# Patient Record
Sex: Male | Born: 1935 | Race: White | Hispanic: No | Smoking: Former smoker
Health system: Southern US, Community
[De-identification: ages and names within clinical notes are randomized; demographics above are authoritative.]

## PROBLEM LIST (undated history)

## (undated) DIAGNOSIS — E538 Deficiency of other specified B group vitamins: Secondary | ICD-10-CM

## (undated) DIAGNOSIS — I2510 Atherosclerotic heart disease of native coronary artery without angina pectoris: Secondary | ICD-10-CM

## (undated) DIAGNOSIS — I340 Nonrheumatic mitral (valve) insufficiency: Secondary | ICD-10-CM

## (undated) DIAGNOSIS — J8410 Pulmonary fibrosis, unspecified: Secondary | ICD-10-CM

## (undated) DIAGNOSIS — I10 Essential (primary) hypertension: Secondary | ICD-10-CM

## (undated) DIAGNOSIS — I4891 Unspecified atrial fibrillation: Secondary | ICD-10-CM

## (undated) DIAGNOSIS — R627 Adult failure to thrive: Secondary | ICD-10-CM

## (undated) DIAGNOSIS — I513 Intracardiac thrombosis, not elsewhere classified: Secondary | ICD-10-CM

## (undated) DIAGNOSIS — I639 Cerebral infarction, unspecified: Secondary | ICD-10-CM

## (undated) HISTORY — PX: CORONARY ARTERY BYPASS GRAFT: SHX141

## (undated) HISTORY — PX: ANKLE FRACTURE SURGERY: SHX122

## (undated) HISTORY — PX: APPENDECTOMY: SHX54

## (undated) HISTORY — PX: MITRAL VALVE ANNULOPLASTY: SHX2038

## (undated) HISTORY — PX: OTHER SURGICAL HISTORY: SHX169

## (undated) HISTORY — PX: CHOLECYSTECTOMY: SHX55

## (undated) HISTORY — PX: CATARACT EXTRACTION: SUR2

## (undated) HISTORY — PX: INGUINAL HERNIA REPAIR: SUR1180

## (undated) HISTORY — PX: ABDOMINAL ADHESION SURGERY: SHX90

---

## 2011-03-07 ENCOUNTER — Emergency Department (INDEPENDENT_AMBULATORY_CARE_PROVIDER_SITE_OTHER): Payer: Medicare Other

## 2011-03-07 ENCOUNTER — Other Ambulatory Visit: Payer: Self-pay

## 2011-03-07 ENCOUNTER — Encounter: Payer: Self-pay | Admitting: *Deleted

## 2011-03-07 ENCOUNTER — Inpatient Hospital Stay (HOSPITAL_BASED_OUTPATIENT_CLINIC_OR_DEPARTMENT_OTHER)
Admission: EM | Admit: 2011-03-07 | Discharge: 2011-03-12 | DRG: 388 | Disposition: A | Discharge status: Home / Self Care | Payer: Medicare Other | Attending: Internal Medicine | Admitting: Internal Medicine

## 2011-03-07 DIAGNOSIS — I700 Atherosclerosis of aorta: Secondary | ICD-10-CM | POA: Diagnosis present

## 2011-03-07 DIAGNOSIS — I2510 Atherosclerotic heart disease of native coronary artery without angina pectoris: Secondary | ICD-10-CM

## 2011-03-07 DIAGNOSIS — Z9889 Other specified postprocedural states: Secondary | ICD-10-CM

## 2011-03-07 DIAGNOSIS — I10 Essential (primary) hypertension: Secondary | ICD-10-CM

## 2011-03-07 DIAGNOSIS — Z951 Presence of aortocoronary bypass graft: Secondary | ICD-10-CM

## 2011-03-07 DIAGNOSIS — Z4659 Encounter for fitting and adjustment of other gastrointestinal appliance and device: Secondary | ICD-10-CM

## 2011-03-07 DIAGNOSIS — Z8673 Personal history of transient ischemic attack (TIA), and cerebral infarction without residual deficits: Secondary | ICD-10-CM

## 2011-03-07 DIAGNOSIS — K565 Intestinal adhesions [bands], unspecified as to partial versus complete obstruction: Principal | ICD-10-CM | POA: Diagnosis present

## 2011-03-07 DIAGNOSIS — I509 Heart failure, unspecified: Secondary | ICD-10-CM | POA: Diagnosis present

## 2011-03-07 DIAGNOSIS — K5730 Diverticulosis of large intestine without perforation or abscess without bleeding: Secondary | ICD-10-CM

## 2011-03-07 DIAGNOSIS — I4891 Unspecified atrial fibrillation: Secondary | ICD-10-CM

## 2011-03-07 DIAGNOSIS — I5031 Acute diastolic (congestive) heart failure: Secondary | ICD-10-CM | POA: Diagnosis present

## 2011-03-07 DIAGNOSIS — D72829 Elevated white blood cell count, unspecified: Secondary | ICD-10-CM | POA: Diagnosis present

## 2011-03-07 DIAGNOSIS — R109 Unspecified abdominal pain: Secondary | ICD-10-CM

## 2011-03-07 DIAGNOSIS — K56609 Unspecified intestinal obstruction, unspecified as to partial versus complete obstruction: Secondary | ICD-10-CM

## 2011-03-07 HISTORY — DX: Atherosclerotic heart disease of native coronary artery without angina pectoris: I25.10

## 2011-03-07 HISTORY — DX: Intracardiac thrombosis, not elsewhere classified: I51.3

## 2011-03-07 HISTORY — DX: Essential (primary) hypertension: I10

## 2011-03-07 HISTORY — DX: Unspecified atrial fibrillation: I48.91

## 2011-03-07 HISTORY — DX: Cerebral infarction, unspecified: I63.9

## 2011-03-07 HISTORY — DX: Deficiency of other specified B group vitamins: E53.8

## 2011-03-07 LAB — DIFFERENTIAL
Basophils Absolute: 0 10*3/uL (ref 0.0–0.1)
Basophils Relative: 0 % (ref 0–1)
Eosinophils Absolute: 0.1 10*3/uL (ref 0.0–0.7)
Monocytes Absolute: 1.3 10*3/uL — ABNORMAL HIGH (ref 0.1–1.0)
Monocytes Relative: 10 % (ref 3–12)

## 2011-03-07 LAB — URINE MICROSCOPIC-ADD ON

## 2011-03-07 LAB — URINALYSIS, ROUTINE W REFLEX MICROSCOPIC
Ketones, ur: NEGATIVE mg/dL
Leukocytes, UA: NEGATIVE
Nitrite: NEGATIVE
Specific Gravity, Urine: 1.044 — ABNORMAL HIGH (ref 1.005–1.030)
pH: 6 (ref 5.0–8.0)

## 2011-03-07 LAB — CBC
HCT: 44.2 % (ref 39.0–52.0)
Hemoglobin: 15.4 g/dL (ref 13.0–17.0)
MCH: 31 pg (ref 26.0–34.0)
MCHC: 34.8 g/dL (ref 30.0–36.0)
RDW: 12.4 % (ref 11.5–15.5)

## 2011-03-07 LAB — COMPREHENSIVE METABOLIC PANEL
Albumin: 4.2 g/dL (ref 3.5–5.2)
BUN: 25 mg/dL — ABNORMAL HIGH (ref 6–23)
Calcium: 10.6 mg/dL — ABNORMAL HIGH (ref 8.4–10.5)
Creatinine, Ser: 1 mg/dL (ref 0.50–1.35)
Total Protein: 7.9 g/dL (ref 6.0–8.3)

## 2011-03-07 LAB — LIPASE, BLOOD: Lipase: 25 U/L (ref 11–59)

## 2011-03-07 LAB — APTT: aPTT: 43 seconds — ABNORMAL HIGH (ref 24–37)

## 2011-03-07 MED ORDER — SODIUM CHLORIDE 0.9 % IV BOLUS (SEPSIS)
1000.0000 mL | Freq: Once | INTRAVENOUS | Status: AC
  Administered 2011-03-07: 1000 mL via INTRAVENOUS

## 2011-03-07 MED ORDER — IOHEXOL 300 MG/ML  SOLN
100.0000 mL | Freq: Once | INTRAMUSCULAR | Status: AC | PRN
  Administered 2011-03-07: 100 mL via INTRAVENOUS

## 2011-03-07 MED ORDER — LIDOCAINE HCL 2 % EX GEL
CUTANEOUS | Status: AC
  Administered 2011-03-07: 0.125
  Filled 2011-03-07: qty 20

## 2011-03-07 MED ORDER — HYDROMORPHONE HCL PF 1 MG/ML IJ SOLN
0.5000 mg | Freq: Once | INTRAMUSCULAR | Status: AC
  Administered 2011-03-07: 0.5 mg via INTRAVENOUS

## 2011-03-07 MED ORDER — HYDROMORPHONE HCL PF 1 MG/ML IJ SOLN
INTRAMUSCULAR | Status: AC
  Filled 2011-03-07: qty 1

## 2011-03-07 MED ORDER — HYDROMORPHONE HCL PF 1 MG/ML IJ SOLN
0.5000 mg | Freq: Once | INTRAMUSCULAR | Status: AC
  Administered 2011-03-07: 0.5 mg via INTRAVENOUS
  Filled 2011-03-07: qty 1

## 2011-03-07 MED ORDER — ONDANSETRON HCL 4 MG/2ML IJ SOLN
4.0000 mg | Freq: Once | INTRAMUSCULAR | Status: AC
  Administered 2011-03-07: 4 mg via INTRAVENOUS
  Filled 2011-03-07: qty 2

## 2011-03-07 NOTE — ED Notes (Signed)
Pt received room assignment of 5528-1 pt and daughter informed of the assignment both concerned as to whether the room is private Huntley Dec RN with [facility] called [phone] asked secretary if this room is private states it is. [facility] present and report given received call from bed placement that states pt unable to be placed in a private room unless "medically ordered for necessity". Pt and daughter refuse to go to a private room therefore bed request  Cancelled and requested private room again. Pt and daughter aware that there may be a significant delay while awaiting private bed. Both continue to state that is what they must have. MD Ray aware of this as well

## 2011-03-07 NOTE — ED Notes (Signed)
Pt aware we need to collect a urine sample ...urinal at the beside IV infusing daughter present

## 2011-03-07 NOTE — ED Notes (Signed)
Patient is resting comfortably. 

## 2011-03-07 NOTE — ED Provider Notes (Signed)
History     CSN: [id number] Arrival date & time: 03/07/2011  9:06 AM   First MD Initiated Contact with Patient 03/07/11 1010      Chief Complaint  Patient presents with  . Weakness  . Emesis    (Consider location/radiation/quality/duration/timing/severity/associated sxs/prior treatment) HPI  Patient here with abdominal pain began last night pain is constant.  Pressure and radiates around to back.  Patient felt weak for several days prior to onset.  Patient  Began having vomiting this a.m. X 3 with greenish color.  Diarrhea x 2-3.  No fever but did have chills.  Patient with similar symptoms previously with c.dif.  S/P hernia repair April 2010, and cholecystectomy.    Past Medical History  Diagnosis Date  . Coronary artery disease   . Stroke   . Atrial fibrillation   . Atrial thrombus   . Hypertension   . B12 deficiency   . Folic acid deficiency    PMD  Dr. Annett Fabian at [facility]  Past Surgical History  Procedure Date  . Inguinal hernia repair   . Coronary artery bypass graft     1993  . Mitral valve anuloplasty   . Cholecystectomy   . Ankle fracture surgery     History reviewed. No pertinent family history.  History  Substance Use Topics  . Smoking status: Former Games developer  . Smokeless tobacco: Never Used  . Alcohol Use: No      Review of Systems  All other systems reviewed and are negative.    Allergies  Review of patient's allergies indicates no known allergies.  Home Medications   Current Outpatient Rx  Name Route Sig Dispense Refill  . DABIGATRAN ETEXILATE MESYLATE 150 MG PO CAPS Oral Take 150 mg by mouth every 12 (twelve) hours.      Marland Kitchen DILTIAZEM HCL ER BEADS 180 MG PO CP24 Oral Take 180 mg by mouth daily.      Marland Kitchen LISINOPRIL 2.5 MG PO TABS Oral Take 2.5 mg by mouth daily.      Marland Kitchen METOPROLOL TARTRATE 50 MG PO TABS Oral Take 75 mg by mouth.      Marland Kitchen PANTOPRAZOLE SODIUM 40 MG PO TBEC Oral Take 40 mg by mouth daily.        BP  132/75  Pulse 75  Temp(Src) 97.6 F (36.4 C) (Oral)  Resp 20  Ht 5\' 7"  (1.702 m)  Wt 145 lb (65.772 kg)  BMI 22.71 kg/m2  SpO2 95%  Physical Exam  Vitals reviewed. Constitutional: He is oriented to person, place, and time. He appears well-developed.  HENT:  Head: Normocephalic and atraumatic.  Eyes: Conjunctivae and EOM are normal. Pupils are equal, round, and reactive to light.  Neck: Normal range of motion. Neck supple.  Cardiovascular: Normal rate and regular rhythm.   Murmur heard. Pulmonary/Chest: Effort normal and breath sounds normal.  Abdominal: Soft. Bowel sounds are normal. There is tenderness.  Musculoskeletal: Normal range of motion.  Neurological: He is alert and oriented to person, place, and time. He has normal reflexes.  Skin: Skin is warm and dry.  Psychiatric: He has a normal mood and affect.    ED Course  Procedures (including critical care time)  Labs Reviewed - No data to display No results found.   No diagnosis found.  Results for orders placed during the hospital encounter of 03/07/11  CBC      Component Value Range   WBC 12.5 (*) 4.0 - 10.5 (K/uL)  RBC 4.96  4.22 - 5.81 (MIL/uL)   Hemoglobin 15.4  13.0 - 17.0 (g/dL)   HCT 56.2  13.0 - 86.5 (%)   MCV 89.1  78.0 - 100.0 (fL)   MCH 31.0  26.0 - 34.0 (pg)   MCHC 34.8  30.0 - 36.0 (g/dL)   RDW 78.4  69.6 - 29.5 (%)   Platelets 199  150 - 400 (K/uL)  DIFFERENTIAL      Component Value Range   Neutrophils Relative 78 (*) 43 - 77 (%)   Neutro Abs 9.7 (*) 1.7 - 7.7 (K/uL)   Lymphocytes Relative 11 (*) 12 - 46 (%)   Lymphs Abs 1.4  0.7 - 4.0 (K/uL)   Monocytes Relative 10  3 - 12 (%)   Monocytes Absolute 1.3 (*) 0.1 - 1.0 (K/uL)   Eosinophils Relative 1  0 - 5 (%)   Eosinophils Absolute 0.1  0.0 - 0.7 (K/uL)   Basophils Relative 0  0 - 1 (%)   Basophils Absolute 0.0  0.0 - 0.1 (K/uL)  COMPREHENSIVE METABOLIC PANEL      Component Value Range   Sodium 140  135 - 145 (mEq/L)   Potassium 4.5   3.5 - 5.1 (mEq/L)   Chloride 101  96 - 112 (mEq/L)   CO2 30  19 - 32 (mEq/L)   Glucose, Bld 119 (*) 70 - 99 (mg/dL)   BUN 25 (*) 6 - 23 (mg/dL)   Creatinine, Ser 2.84  0.50 - 1.35 (mg/dL)   Calcium 13.2 (*) 8.4 - 10.5 (mg/dL)   Total Protein 7.9  6.0 - 8.3 (g/dL)   Albumin 4.2  3.5 - 5.2 (g/dL)   AST 25  0 - 37 (U/L)   ALT 13  0 - 53 (U/L)   Alkaline Phosphatase 68  39 - 117 (U/L)   Total Bilirubin 0.5  0.3 - 1.2 (mg/dL)   GFR calc non Af Amer 69 (*) >90 (mL/min)   GFR calc Af Amer 80 (*) >90 (mL/min)  LIPASE, BLOOD      Component Value Range   Lipase 25  11 - 59 (U/L)  URINALYSIS, ROUTINE W REFLEX MICROSCOPIC      Component Value Range   Color, Urine YELLOW  YELLOW    APPearance CLEAR  CLEAR    Specific Gravity, Urine 1.044 (*) 1.005 - 1.030    pH 6.0  5.0 - 8.0    Glucose, UA NEGATIVE  NEGATIVE (mg/dL)   Hgb urine dipstick MODERATE (*) NEGATIVE    Bilirubin Urine NEGATIVE  NEGATIVE    Ketones, ur NEGATIVE  NEGATIVE (mg/dL)   Protein, ur NEGATIVE  NEGATIVE (mg/dL)   Urobilinogen, UA 0.2  0.0 - 1.0 (mg/dL)   Nitrite NEGATIVE  NEGATIVE    Leukocytes, UA NEGATIVE  NEGATIVE   URINE MICROSCOPIC-ADD ON      Component Value Range   Squamous Epithelial / LPF RARE  RARE    WBC, UA 0-2  <3 (WBC/hpf)   RBC / HPF 11-20  <3 (RBC/hpf)   Bacteria, UA FEW (*) RARE   PROTIME-INR      Component Value Range   Prothrombin Time 15.4 (*) 11.6 - 15.2 (seconds)   INR 1.19  0.00 - 1.49   APTT      Component Value Range   aPTT 43 (*) 24 - 37 (seconds)   Ct Abdomen Pelvis W Contrast  03/07/2011  *RADIOLOGY REPORT*  Clinical Data: Abdominal pain.  Coronary artery disease. Cholecystectomy.  CT ABDOMEN  AND PELVIS WITH CONTRAST  Technique:  Multidetector CT imaging of the abdomen and pelvis was performed following the standard protocol during bolus administration of intravenous contrast.  Contrast: OMNIPAQUE IOHEXOL 300 MG/ML IV SOLN  Comparison: None  Findings: There is a reticular pattern at  the lung bases consistent with mild interstitial lung disease.  No focal consolidation. Heart is enlarged.  No pericardial fluid.  There is no focal hepatic lesion.  There is mild intrahepatic biliary duct dilatation likely related to prior cholecystectomy. Pancreas, spleen, and adrenal glands are normal.  There is severe cortical thinning of the left kidney which may relate to prior infarction.  There is simple cyst of the left kidney.  There is cortical scarring of the right kidney additionally.  Several simple right renal cysts are present.  The stomach is moderately distended with the oral contrast.  The duodenum is not dilated.  The third portion the duodenum does not cross the spine and turns back towards the right upper quadrant (image 38).  The majority of the small bowel is in the right upper quadrant.  These findings are consistent with malrotation.  There are dilated loops of proximal small bowel up to 4 cm.  The distal ileum is collapsed at 10 mm.  There is a long segment of collapsed ileum.  There is no clear transition point and  there are multiple dilated loops of proximal small bowel.  There is enhancement within the bowel wall and prominence of the vessels within the mesentery. No significant bowel wall thickening.  No portal venous gas.  The colon is predominately collapsed without evidence of mass lesion.  There are diverticula in the sigmoid colon which is severe.  Stool ball in the rectum.  Abdominal aorta normal caliber.  There is no evidence of portal venous gas.  No evidence of intraperitoneal free air.  No free fluid the pelvis.  The bladder prostate gland are normal. Review of  bone windows demonstrates no aggressive osseous lesions.  IMPRESSION:  1.  Findings consistent with early mechanical small bowel obstruction.  Etiology would include adhesions or malrotation. 2.  The patient has malrotated small bowel anatomy. 3.  Enhancement of the small bowel mucosa with vascular engorgement.  No   portal venous gas or significant bowel wall thickening.  Recommend close observation for ischemia.  4.  Severe sigmoid diverticulosis without diverticulitis. 5.  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  Findings discussed Dr. Rosalia Hammers on 03/07/2011 at 12:20 p.m.  Original Report Authenticated By: Genevive Bi, M.D.    MDM  Patient feels better.  CT results discussed with Dr. Amil Amen.  Dr.Byers consulted and patient will be transported to [facility] to hospitalist service.       Date: 03/07/2011  Rate: atrial fib   Rhythm: atrial fibrillation  QRS Axis: normal  Intervals: normal  ST/T Wave abnormaliti  twave inversion diffusely  Conduction Disutrbances:none  Narrative Interpretation:   Old EKG Reviewed: none available    Hilario Quarry, MD 03/07/11 1622

## 2011-03-07 NOTE — Progress Notes (Signed)
Flow manger called and spoke with about admission orders and assessment to be done by admission MD. Was in formed pt. Was on the MD's admission list and would assess pt. As soon as possible.

## 2011-03-07 NOTE — ED Notes (Signed)
Family at bedside. 

## 2011-03-07 NOTE — ED Notes (Signed)
New bed assignment received 4715 at [facility] tele bed. Report given to sara rn with [facility] will call report to rn on 4700 call placed to patients daughter to notify of bed assignment.

## 2011-03-07 NOTE — Progress Notes (Addendum)
Pt admitted to unit. Pt oriented to unit. Pt assessed. VSS. NGT in place. Flow manager and Dr. Eda Paschal notified of pt's arrival to unit. Pt does not appear in any immediate distress. "Living better with heart failure" packet given to patient. Will continue to monitor.

## 2011-03-07 NOTE — ED Notes (Signed)
Daughter notified by phone that pt is being transported to [place] tele bed room 4715 verbalizes understanding and states will meet him there

## 2011-03-07 NOTE — Plan of Care (Signed)
Problem: Phase I Progression Outcomes Goal: Other Phase I Outcomes/Goals Outcome: Progressing Pt has hx of heart failure. "Living better with heart failure" packet given to pt. Will continue to educate.

## 2011-03-07 NOTE — Progress Notes (Signed)
Flow manager notified again that MD has not come to write admission orders on pt. Flow manger paged admission MD again.

## 2011-03-07 NOTE — ED Notes (Signed)
pts ng tube clamped for transport report given to [employer] on 4700

## 2011-03-07 NOTE — ED Notes (Signed)
Felt bad yesterday last night started feeling nauseated started vomited 2 times this am about 8 am states he feels much better now that he vomited also had 2 episodes of diarrhea during the night none today. Pt awake alert and oriented having some abdominal cramping now

## 2011-03-08 ENCOUNTER — Encounter (HOSPITAL_COMMUNITY): Payer: Self-pay | Admitting: General Surgery

## 2011-03-08 ENCOUNTER — Other Ambulatory Visit: Payer: Self-pay

## 2011-03-08 DIAGNOSIS — I4891 Unspecified atrial fibrillation: Secondary | ICD-10-CM

## 2011-03-08 DIAGNOSIS — I2510 Atherosclerotic heart disease of native coronary artery without angina pectoris: Secondary | ICD-10-CM

## 2011-03-08 DIAGNOSIS — I359 Nonrheumatic aortic valve disorder, unspecified: Secondary | ICD-10-CM

## 2011-03-08 DIAGNOSIS — Z954 Presence of other heart-valve replacement: Secondary | ICD-10-CM

## 2011-03-08 DIAGNOSIS — K56609 Unspecified intestinal obstruction, unspecified as to partial versus complete obstruction: Secondary | ICD-10-CM

## 2011-03-08 DIAGNOSIS — R109 Unspecified abdominal pain: Secondary | ICD-10-CM

## 2011-03-08 LAB — COMPREHENSIVE METABOLIC PANEL
ALT: 11 U/L (ref 0–53)
Albumin: 3.2 g/dL — ABNORMAL LOW (ref 3.5–5.2)
Alkaline Phosphatase: 59 U/L (ref 39–117)
Chloride: 100 mEq/L (ref 96–112)
GFR calc Af Amer: 79 mL/min — ABNORMAL LOW (ref >90)
Glucose, Bld: 128 mg/dL — ABNORMAL HIGH (ref 70–99)
Potassium: 4.3 mEq/L (ref 3.5–5.1)
Sodium: 138 mEq/L (ref 135–145)
Total Bilirubin: 0.7 mg/dL (ref 0.3–1.2)
Total Protein: 6.8 g/dL (ref 6.0–8.3)

## 2011-03-08 LAB — CARDIAC PANEL(CRET KIN+CKTOT+MB+TROPI)
CK, MB: 2.2 ng/mL (ref 0.3–4.0)
CK, MB: 2.1 ng/mL (ref 0.3–4.0)
CK, MB: 2.4 ng/mL (ref 0.3–4.0)
Relative Index: INVALID (ref 0.0–2.5)
Troponin I: <0.3 ng/mL (ref <0.30)
Troponin I: <0.3 ng/mL (ref <0.30)

## 2011-03-08 LAB — CBC
HCT: 41.2 % (ref 39.0–52.0)
MCHC: 34.2 g/dL (ref 30.0–36.0)
MCV: 92 fL (ref 78.0–100.0)
Platelets: 173 10*3/uL (ref 150–400)
RDW: 12.9 % (ref 11.5–15.5)

## 2011-03-08 LAB — HEPARIN LEVEL (UNFRACTIONATED): Heparin Unfractionated: 0.75 IU/mL — ABNORMAL HIGH (ref 0.30–0.70)

## 2011-03-08 MED ORDER — DILTIAZEM HCL ER COATED BEADS 180 MG PO CP24: 180.0000 mg | ORAL_CAPSULE | Freq: Every day | ORAL | Status: DC

## 2011-03-08 MED ORDER — DILTIAZEM HCL 60 MG PO TABS
60.0000 mg | ORAL_TABLET | Freq: Four times a day (QID) | ORAL | Status: DC
  Administered 2011-03-08 (×2): 60 mg via ORAL
  Filled 2011-03-08 (×6): qty 1

## 2011-03-08 MED ORDER — HEPARIN SOD (PORCINE) IN D5W 100 UNIT/ML IV SOLN
1050.0000 [IU]/h | INTRAVENOUS | Status: DC
  Administered 2011-03-08: 1000 [IU]/h via INTRAVENOUS
  Administered 2011-03-09: 1100 [IU]/h via INTRAVENOUS
  Administered 2011-03-09 – 2011-03-10 (×2): 950 [IU]/h via INTRAVENOUS
  Administered 2011-03-11: 1050 [IU]/h via INTRAVENOUS
  Administered 2011-03-11: 950 [IU]/h via INTRAVENOUS
  Filled 2011-03-08 (×8): qty 250

## 2011-03-08 MED ORDER — ACETAMINOPHEN 325 MG PO TABS: 650.0000 mg | ORAL_TABLET | ORAL | Status: DC | PRN

## 2011-03-08 MED ORDER — HEPARIN BOLUS VIA INFUSION
2000.0000 [IU] | Freq: Once | INTRAVENOUS | Status: AC
  Administered 2011-03-08: 2000 [IU] via INTRAVENOUS
  Filled 2011-03-08: qty 2000

## 2011-03-08 MED ORDER — DILTIAZEM HCL ER BEADS 180 MG PO CP24: 180.0000 mg | ORAL_CAPSULE | Freq: Every day | ORAL | Status: DC

## 2011-03-08 MED ORDER — ASPIRIN 325 MG PO TABS
325.0000 mg | ORAL_TABLET | Freq: Every day | ORAL | Status: DC
  Administered 2011-03-08: 325 mg via ORAL
  Filled 2011-03-08: qty 1

## 2011-03-08 MED ORDER — KCL IN DEXTROSE-NACL 10-5-0.45 MEQ/L-%-% IV SOLN
INTRAVENOUS | Status: DC
  Administered 2011-03-08 – 2011-03-10 (×6): via INTRAVENOUS
  Filled 2011-03-08 (×11): qty 1000

## 2011-03-08 MED ORDER — ROSUVASTATIN CALCIUM 20 MG PO TABS
20.0000 mg | ORAL_TABLET | Freq: Every day | ORAL | Status: DC
  Administered 2011-03-08: 20 mg via ORAL
  Filled 2011-03-08: qty 1

## 2011-03-08 MED ORDER — METOPROLOL TARTRATE 1 MG/ML IV SOLN
2.5000 mg | Freq: Four times a day (QID) | INTRAVENOUS | Status: DC
  Administered 2011-03-08: 2.5 mg via INTRAVENOUS
  Filled 2011-03-08: qty 5

## 2011-03-08 MED ORDER — KETOROLAC TROMETHAMINE 30 MG/ML IJ SOLN
30.0000 mg | Freq: Four times a day (QID) | INTRAMUSCULAR | Status: DC | PRN
Start: 2011-03-08 — End: 2011-03-12
  Administered 2011-03-08 (×2): 30 mg via INTRAVENOUS
  Filled 2011-03-08 (×2): qty 1

## 2011-03-08 MED ORDER — METOPROLOL TARTRATE 1 MG/ML IV SOLN
5.0000 mg | Freq: Once | INTRAVENOUS | Status: AC
  Administered 2011-03-08: 5 mg via INTRAVENOUS
  Filled 2011-03-08: qty 5

## 2011-03-08 MED ORDER — PANTOPRAZOLE SODIUM 40 MG IV SOLR
40.0000 mg | Freq: Two times a day (BID) | INTRAVENOUS | Status: DC
  Administered 2011-03-08 – 2011-03-12 (×9): 40 mg via INTRAVENOUS
  Filled 2011-03-08 (×10): qty 40

## 2011-03-08 MED ORDER — METOPROLOL TARTRATE 1 MG/ML IV SOLN
5.0000 mg | INTRAVENOUS | Status: DC
  Administered 2011-03-08 – 2011-03-10 (×11): 5 mg via INTRAVENOUS
  Filled 2011-03-08 (×20): qty 5

## 2011-03-08 NOTE — Progress Notes (Signed)
Utilization review complete 

## 2011-03-08 NOTE — Progress Notes (Signed)
ANTICOAGULATION CONSULT NOTE - Initial Consult  Pharmacy Consult for Heparin  Indication: atrial fibrillation  No Known Allergies  Patient Measurements: Height: 5\' 7"  (170.2 cm) Weight: 143 lb 15.4 oz (65.3 kg) (bedscale) IBW/kg (Calculated) : 66.1   Vital Signs: Temp: 99.1 F (37.3 C) (12/03 2141) Temp src: Oral (12/03 2141) BP: 128/85 mmHg (12/03 2141) Pulse Rate: 102  (12/03 2141)  Labs:  Basename 03/08/11 0025 03/07/11 0940  HGB -- 15.4  HCT -- 44.2  PLT -- 199  APTT -- 43*  LABPROT -- 15.4*  INR -- 1.19  HEPARINUNFRC -- --  CREATININE -- 1.00  CKTOTAL 37 --  CKMB 2.2 --  TROPONINI <0.30 --   Estimated Creatinine Clearance: 55.3 ml/min (by C-G formula based on Cr of 1).  Medical History: Past Medical History  Diagnosis Date  . Coronary artery disease   . Stroke   . Atrial fibrillation   . Atrial thrombus   . Hypertension   . B12 deficiency   . Folic acid deficiency     Medications:  Prescriptions prior to admission  Medication Sig Dispense Refill  . dabigatran (PRADAXA) 150 MG CAPS Take 150 mg by mouth every 12 (twelve) hours.        Marland Kitchen diltiazem (TIAZAC) 180 MG 24 hr capsule Take 180 mg by mouth daily.        Marland Kitchen lisinopril (PRINIVIL,ZESTRIL) 2.5 MG tablet Take 2.5 mg by mouth daily.        . metoprolol (LOPRESSOR) 50 MG tablet Take 75 mg by mouth 2 (two) times daily.       . Multiple Vitamin (MULTIVITAMIN) tablet Take 1 tablet by mouth daily.        . pantoprazole (PROTONIX) 40 MG tablet Take 40 mg by mouth daily.          Assessment: 77 yo male with Afib, Pradaxa on hold for SBO, for Heparin  Goal of Therapy:  Heparin level 0.3-0.7 units/ml   Plan: Heparin 2000 units IV bolus, 1000 units/hr.  Check heparin level in 8 hours.   Venise Ellingwood, Gary Fleet 03/08/2011,1:44 AM

## 2011-03-08 NOTE — Progress Notes (Signed)
Subjective: BM overnight but no further flatus  Objective: Vital signs in last 24 hours: Temp:  [98.5 F (36.9 C)-99.1 F (37.3 C)] 98.9 F (37.2 C) (12/04 0512) Pulse Rate:  [78-121] 101  (12/04 0512) Resp:  [18-20] 18  (12/04 0512) BP: (122-142)/(74-91) 132/74 mmHg (12/04 0553) SpO2:  [93 %-100 %] 95 % (12/04 0512) Weight:  [65.3 kg (143 lb 15.4 oz)-65.4 kg (144 lb 2.9 oz)] 144 lb 2.9 oz (65.4 kg) (12/04 0512) Last BM Date: 03/07/11  Intake/Output from previous day: 12/03 0701 - 12/04 0700 In: 697 [P.O.:60; I.V.:637] Out: 950 [Urine:250; Emesis/NG output:700] Intake/Output this shift:    General appearance: alert Resp: clear to auscultation bilaterally Cardio: irregularly irregular rhythm GI: Soft, NT, active BC, mild dist  Lab Results:   [facility] 03/08/11 0515 03/07/11 0940  WBC 12.9* 12.5*  HGB 14.1 15.4  HCT 41.2 44.2  PLT 173 199   BMET  Basename 03/08/11 0515 03/07/11 0940  NA 138 140  K 4.3 4.5  CL 100 101  CO2 27 30  GLUCOSE 128* 119*  BUN 19 25*  CREATININE 1.01 1.00  CALCIUM 8.7 10.6*   PT/INR  Basename 03/08/11 0515 03/07/11 0940  LABPROT 17.9* 15.4*  INR 1.45 1.19   ABG No results found for this basename: PHART:2,PCO2:2,PO2:2,HCO3:2 in the last 72 hours  Studies/Results: Ct Abdomen Pelvis W Contrast  03/07/2011  *RADIOLOGY REPORT*  Clinical Data: Abdominal pain.  Coronary artery disease. Cholecystectomy.  CT ABDOMEN AND PELVIS WITH CONTRAST  Technique:  Multidetector CT imaging of the abdomen and pelvis was performed following the standard protocol during bolus administration of intravenous contrast.  Contrast: OMNIPAQUE IOHEXOL 300 MG/ML IV SOLN  Comparison: None  Findings: There is a reticular pattern at the lung bases consistent with mild interstitial lung disease.  No focal consolidation. Heart is enlarged.  No pericardial fluid.  There is no focal hepatic lesion.  There is mild intrahepatic biliary duct dilatation likely related  to prior cholecystectomy. Pancreas, spleen, and adrenal glands are normal.  There is severe cortical thinning of the left kidney which may relate to prior infarction.  There is simple cyst of the left kidney.  There is cortical scarring of the right kidney additionally.  Several simple right renal cysts are present.  The stomach is moderately distended with the oral contrast.  The duodenum is not dilated.  The third portion the duodenum does not cross the spine and turns back towards the right upper quadrant (image 38).  The majority of the small bowel is in the right upper quadrant.  These findings are consistent with malrotation.  There are dilated loops of proximal small bowel up to 4 cm.  The distal ileum is collapsed at 10 mm.  There is a long segment of collapsed ileum.  There is no clear transition point and  there are multiple dilated loops of proximal small bowel.  There is enhancement within the bowel wall and prominence of the vessels within the mesentery. No significant bowel wall thickening.  No portal venous gas.  The colon is predominately collapsed without evidence of mass lesion.  There are diverticula in the sigmoid colon which is severe.  Stool ball in the rectum.  Abdominal aorta normal caliber.  There is no evidence of portal venous gas.  No evidence of intraperitoneal free air.  No free fluid the pelvis.  The bladder prostate gland are normal. Review of  bone windows demonstrates no aggressive osseous lesions.  IMPRESSION:  1.  Findings consistent with early mechanical small bowel obstruction.  Etiology would include adhesions or malrotation. 2.  The patient has malrotated small bowel anatomy. 3.  Enhancement of the small bowel mucosa with vascular engorgement.  No  portal venous gas or significant bowel wall thickening.  Recommend close observation for ischemia.  4.  Severe sigmoid diverticulosis without diverticulitis. 5.  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  Findings  discussed Dr. Rosalia Hammers on 03/07/2011 at 12:20 p.m.  Original Report Authenticated By: Genevive Bi, M.D.   Dg Chest Port 1 View  03/07/2011  *RADIOLOGY REPORT*  Clinical Data: Nasogastric placement  PORTABLE CHEST - 1 VIEW  Comparison: None.  Findings: Nasogastric tube enters the stomach.  The lungs show abnormal interstitial markings that could be due to chronic scarring or interstitial edema.  IMPRESSION: Nasogastric tube enters the stomach.  Abnormal interstitial pulmonary density could be scarring or edema.  Original Report Authenticated By: Thomasenia Sales, M.D.    Anti-infectives: Anti-infectives    None      Assessment/Plan: SBO likely due to adhesions - cont NGT, check F/U x-rays in AM, hope this will resolve without surgery A-Fib - per cardiology, I D/W Dr. Patty Sermons I discussed plan with patient's daughter who is a nurse   LOS: 1 day    [name] 03/08/2011

## 2011-03-08 NOTE — Progress Notes (Signed)
Subjective: Patient is complaining of some throat pain and is also complaining of some epigastric pain. He has not passed any gas yet. He denies any palpitations.   Physical Exam: Blood pressure 133/79, pulse 95, temperature 98.6 F (37 C), temperature source Oral, resp. rate 18, height 5\' 7"  (1.702 m), weight 65.4 kg (144 lb 2.9 oz), SpO2 90.00%. Alert and oriented x3  Heart tachycardic irregularly irregular no murmurs  Chest clear to auscultation bilaterally  Abdomen distended quiet bowel sounds   Investigations:  No results found for this or any previous visit (from the past 240 hour(s)).   Basic Metabolic Panel:  Basename 03/08/11 0515 03/07/11 0940  NA 138 140  K 4.3 4.5  CL 100 101  CO2 27 30  GLUCOSE 128* 119*  BUN 19 25*  CREATININE 1.01 1.00  CALCIUM 8.7 10.6*  MG -- --  PHOS -- --   Liver Function Tests:  [facility] 03/08/11 0515 03/07/11 0940  AST 19 25  ALT 11 13  ALKPHOS 59 68  BILITOT 0.7 0.5  PROT 6.8 7.9  ALBUMIN 3.2* 4.2     CBC:  Basename 03/08/11 0515 03/07/11 0940  WBC 12.9* 12.5*  NEUTROABS -- 9.7*  HGB 14.1 15.4  HCT 41.2 44.2  MCV 92.0 89.1  PLT 173 199    Ct Abdomen Pelvis W Contrast  03/07/2011  *RADIOLOGY REPORT*  Clinical Data: Abdominal pain.  Coronary artery disease. Cholecystectomy.  CT ABDOMEN AND PELVIS WITH CONTRAST  Technique:  Multidetector CT imaging of the abdomen and pelvis was performed following the standard protocol during bolus administration of intravenous contrast.  Contrast: OMNIPAQUE IOHEXOL 300 MG/ML IV SOLN  Comparison: None  Findings: There is a reticular pattern at the lung bases consistent with mild interstitial lung disease.  No focal consolidation. Heart is enlarged.  No pericardial fluid.  There is no focal hepatic lesion.  There is mild intrahepatic biliary duct dilatation likely related to prior cholecystectomy. Pancreas, spleen, and adrenal glands are normal.  There is severe cortical thinning of  the left kidney which may relate to prior infarction.  There is simple cyst of the left kidney.  There is cortical scarring of the right kidney additionally.  Several simple right renal cysts are present.  The stomach is moderately distended with the oral contrast.  The duodenum is not dilated.  The third portion the duodenum does not cross the spine and turns back towards the right upper quadrant (image 38).  The majority of the small bowel is in the right upper quadrant.  These findings are consistent with malrotation.  There are dilated loops of proximal small bowel up to 4 cm.  The distal ileum is collapsed at 10 mm.  There is a long segment of collapsed ileum.  There is no clear transition point and  there are multiple dilated loops of proximal small bowel.  There is enhancement within the bowel wall and prominence of the vessels within the mesentery. No significant bowel wall thickening.  No portal venous gas.  The colon is predominately collapsed without evidence of mass lesion.  There are diverticula in the sigmoid colon which is severe.  Stool ball in the rectum.  Abdominal aorta normal caliber.  There is no evidence of portal venous gas.  No evidence of intraperitoneal free air.  No free fluid the pelvis.  The bladder prostate gland are normal. Review of  bone windows demonstrates no aggressive osseous lesions.  IMPRESSION:  1.  Findings consistent with early  mechanical small bowel obstruction.  Etiology would include adhesions or malrotation. 2.  The patient has malrotated small bowel anatomy. 3.  Enhancement of the small bowel mucosa with vascular engorgement.  No  portal venous gas or significant bowel wall thickening.  Recommend close observation for ischemia.  4.  Severe sigmoid diverticulosis without diverticulitis. 5.  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  Findings discussed Dr. Rosalia Hammers on 03/07/2011 at 12:20 p.m.  Original Report Authenticated By: Genevive Bi, M.D.   Dg Chest  Port 1 View  03/07/2011  *RADIOLOGY REPORT*  Clinical Data: Nasogastric placement  PORTABLE CHEST - 1 VIEW  Comparison: None.  Findings: Nasogastric tube enters the stomach.  The lungs show abnormal interstitial markings that could be due to chronic scarring or interstitial edema.  IMPRESSION: Nasogastric tube enters the stomach.  Abnormal interstitial pulmonary density could be scarring or edema.  Original Report Authenticated By: Thomasenia Sales, M.D.      Medications:  Scheduled:   . heparin  2,000 Units Intravenous Once  . metoprolol  5 mg Intravenous Once  . metoprolol  5 mg Intravenous Q4H  . pantoprazole (PROTONIX) IV  40 mg Intravenous Q12H  . DISCONTD: aspirin  325 mg Oral Daily  . DISCONTD: diltiazem  180 mg Oral Daily  . DISCONTD: diltiazem  60 mg Oral Q6H  . DISCONTD: diltiazem  180 mg Oral Daily  . DISCONTD: metoprolol  2.5 mg Intravenous Q6H  . DISCONTD: rosuvastatin  20 mg Oral Daily    Impression:  Principal Problem:  *SBO (small bowel obstruction) Active Problems:  A-fib  CAD (coronary artery disease)  S/P CABG (coronary artery bypass graft)  HTN (hypertension)  S/P MVR (mitral valve repair)     Plan: Continue nasogastric tube suctioning and status nothing by mouth Continue with intravenous heparin for full anticoagulation given history of stroke and valve repair Add intravenous metoprolol for better rate control Add intravenous Protonix for symptoms of acid reflux Encouraged patient to ambulate Appreciate cardiology and surgery input     LOS: 1 day   Levonia Wolfley, MD Pager: [phone] 03/08/2011, 3:03 PM

## 2011-03-08 NOTE — H&P (Addendum)
PCP:  Clemens Catholic Dr.AMEENS at [facility]  Chief Complaint:  Abdominal distention and vomiting for one day  HPI: This is a 77 year old male with a history of coronary artery disease status post CABG, A. fib on  On pradaxa, mitral valve repair, he presented to the hospital with abdomen distention since last night with a pressure feeling that radiated to his back, associated with bilious vomitus, has a couple of diarrhea, he had had had a repair on April of 2000 and and cholecystectomy. On [place] [facility], CT abdomen suggest bowel obstruction and malrotation of the small bowel, surgical consultation by Dr. Gery Pray and the hospitalist service we're asked to admit  Re view of Systems:   patient complained of mild chest pain, no shortness of breath, no headache no blurring of vision no numbness or weakness, no burning micturition  Past Medical History: Past Medical History  Diagnosis Date  . Coronary artery disease   . Stroke   . Atrial fibrillation   . Atrial thrombus   . Hypertension   . B12 deficiency   . Folic acid deficiency    Past Surgical History  Procedure Date  . Inguinal hernia repair   . Coronary artery bypass graft     1993  . Mitral valve anuloplasty   . Cholecystectomy   . Ankle fracture surgery     Medications: Prior to Admission medications   Medication Sig Start Date End Date Taking? Authorizing Provider  dabigatran (PRADAXA) 150 MG CAPS Take 150 mg by mouth every 12 (twelve) hours.     Yes Historical Provider, MD  diltiazem (TIAZAC) 180 MG 24 hr capsule Take 180 mg by mouth daily.     Yes Historical Provider, MD  lisinopril (PRINIVIL,ZESTRIL) 2.5 MG tablet Take 2.5 mg by mouth daily.     Yes Historical Provider, MD  metoprolol (LOPRESSOR) 50 MG tablet Take 75 mg by mouth 2 (two) times daily.    Yes Historical Provider, MD  Multiple Vitamin (MULTIVITAMIN) tablet Take 1 tablet by mouth daily.     Yes Historical Provider, MD  pantoprazole  (PROTONIX) 40 MG tablet Take 40 mg by mouth daily.     Yes Historical Provider, MD    Allergies:  No Known Allergies  Social History:  denies any smoking, or alcohol abuse, his a widow, he has a daughter worked at [employer] as a Engineer, civil (consulting) Family History: Non-contributive   Physical Exam: Filed Vitals:   03/07/11 1609 03/07/11 1748 03/07/11 1903 03/07/11 2141  BP: 132/76 138/84 142/85 128/85  Pulse: 78 84 96 102  Temp: 98.5 F (36.9 C)  98.5 F (36.9 C) 99.1 F (37.3 C)  TempSrc:    Oral  Resp: 18  20 18   Height:   5\' 7"  (1.702 m)   Weight:   65.3 kg (143 lb 15.4 oz)   SpO2:  100% 97% 93%   Lying comfortably on bed not on respiratory distress or shortness of breath   Supple no lymphadenopathy, pupil equally reactive to light and accommodation  heart S1 and S2 irregular irregular Lungs normal vesicular breathing with equal air entry Abdomen distended non tender, there is bowel sounds but very sluggish Extremities without lower limb edema and pulses intact CNS awake alert oriented x3 with no focal neuro deficit   Labs on Admission:   [facility] 03/07/11 0940  NA 140  K 4.5  CL 101  CO2 30  GLUCOSE 119*  BUN 25*  CREATININE 1.00  CALCIUM  10.6*  MG --  PHOS --    Basename 03/07/11 0940  AST 25  ALT 13  ALKPHOS 68  BILITOT 0.5  PROT 7.9  ALBUMIN 4.2    Basename 03/07/11 0940  LIPASE 25  AMYLASE --    Basename 03/07/11 0940  WBC 12.5*  NEUTROABS 9.7*  HGB 15.4  HCT 44.2  MCV 89.1  PLT 199   Radiological Exams on Admission: Ct Abdomen Pelvis W Contrast  03/07/2011  *RADIOLOGY REPORT*  Clinical Data: Abdominal pain.  Coronary artery disease. Cholecystectomy.  CT ABDOMEN AND PELVIS WITH CONTRAST  Technique:  Multidetector CT imaging of the abdomen and pelvis was performed following the standard protocol during bolus administration of intravenous contrast.  Contrast: OMNIPAQUE IOHEXOL 300 MG/ML IV SOLN  Comparison: None  Findings: There  is a reticular pattern at the lung bases consistent with mild interstitial lung disease.  No focal consolidation. Heart is enlarged.  No pericardial fluid.  There is no focal hepatic lesion.  There is mild intrahepatic biliary duct dilatation likely related to prior cholecystectomy. Pancreas, spleen, and adrenal glands are normal.  There is severe cortical thinning of the left kidney which may relate to prior infarction.  There is simple cyst of the left kidney.  There is cortical scarring of the right kidney additionally.  Several simple right renal cysts are present.  The stomach is moderately distended with the oral contrast.  The duodenum is not dilated.  The third portion the duodenum does not cross the spine and turns back towards the right upper quadrant (image 38).  The majority of the small bowel is in the right upper quadrant.  These findings are consistent with malrotation.  There are dilated loops of proximal small bowel up to 4 cm.  The distal ileum is collapsed at 10 mm.  There is a long segment of collapsed ileum.  There is no clear transition point and  there are multiple dilated loops of proximal small bowel.  There is enhancement within the bowel wall and prominence of the vessels within the mesentery. No significant bowel wall thickening.  No portal venous gas.  The colon is predominately collapsed without evidence of mass lesion.  There are diverticula in the sigmoid colon which is severe.  Stool ball in the rectum.  Abdominal aorta normal caliber.  There is no evidence of portal venous gas.  No evidence of intraperitoneal free air.  No free fluid the pelvis.  The bladder prostate gland are normal. Review of  bone windows demonstrates no aggressive osseous lesions.  IMPRESSION:  1.  Findings consistent with early mechanical small bowel obstruction.  Etiology would include adhesions or malrotation. 2.  The patient has malrotated small bowel anatomy. 3.  Enhancement of the small bowel mucosa with  vascular engorgement.  No  portal venous gas or significant bowel wall thickening.  Recommend close observation for ischemia.  4.  Severe sigmoid diverticulosis without diverticulitis. 5.  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  Findings discussed Dr. Rosalia Hammers on 03/07/2011 at 12:20 p.m.  Original Report Authenticated By: Genevive Bi, M.D.   Dg Chest Port 1 View  03/07/2011  *RADIOLOGY REPORT*  Clinical Data: Nasogastric placement  PORTABLE CHEST - 1 VIEW  Comparison: None.  Findings: Nasogastric tube enters the stomach.  The lungs show abnormal interstitial markings that could be due to chronic scarring or interstitial edema.  IMPRESSION: Nasogastric tube enters the stomach.  Abnormal interstitial pulmonary density could be scarring or edema.  Original Report Authenticated By: Thomasenia Sales, M.D.    Assessment/Plan 1-small bowel obstruction : Patient had a large bowel movement when he arrived to the hospital and he has bowel sounds but sluggish. CT  fat vascular congestion and malrotation. I would treat supportively with IV fluid and NG tube suctioning. I did consult surgery to see him. Currently he is vital sign is stable, and his blood workup seemed stable.  2-A. fib with RVR will start the patient on metoprolol IV, I would continue with her Cardizem dose, patient on dabigatran, will holdpradaxa and start heparin drip as patient has history of atrial thrombus/afib 3- mild chest pain : EKG suggest diffuse t wave inversion. Will DW with cardiology . I spoke to dr turner and will assess EKG, NO ekg to compare, cycle enzymes. 4- MILD LEUKOCYTOSIS: likely from the above , stool cdiff PCR Ritha Sampedro I. 03/08/2011, 12:16 AM

## 2011-03-08 NOTE — Progress Notes (Signed)
Clinical Social Work, 03/08/11, 1430:  Per patient and family request, CSW notarized advanced directive paperwork for patient.  Patient was alert and oriented and appreciative of support.  Patient has no further CSW needs so CSW will sign off.  Please inform CSW of any additional needs.Ysabela Keisler, [place] , [place] #[phone]

## 2011-03-08 NOTE — Progress Notes (Signed)
  Echocardiogram 2D Echocardiogram has been performed.  Jordan Blackwell 03/08/2011, 3:43 PM

## 2011-03-08 NOTE — Progress Notes (Signed)
Pt. HR running from 100-130's A-fib with RVR per EKG. MD aware orders given. Please see MD's H&P.

## 2011-03-08 NOTE — Consult Note (Signed)
Referring Physician: Eda Paschal (Hospitalist) Primary Cardiologist: Jerilynn Birkenhead [facility]) Reason for Consultation: CAD s/p CABG, Atrial Fibrillation, MVR in the setting of SBO  HPI: 77 year old white male with a past medical history significant for coronary artery disease status post coronary artery bypass grafting, atrial fibrillation on chronic anticoagulation, history of mitral valve repair, history of cerebrovascular accident, and possible atrial thrombus, who presents in consultation for evaluation and management of the above chronic cardiac conditions in the setting of a small bowel obstruction. The patient was in his usual state of health until approximately one week ago he began to feel weak. Yesterday he developed a multiple episodes of bilious emesis. Ultimately, he was seen and evaluated and was diagnosed with a small bowel obstruction. He was transferred [facility] for bowel rest NG suction and surgical consultation. Upon arrival the patient was noted to be atrial fibrillation with ventricular response of 120 beats per minute. Cardiology was consulted and for recommendations regarding his chronic cardiac conditions.  The patient denies any active chest pain. Right now. His only complaint is abdominal distention and abdominal pain. He denies any shortness of breath, PND, orthopnea, or lower extremity edema. He denies any episodes of syncope.  Past medical history. #1 coronary artery disease status post coronary bypass grafting. #2 atrial fibrillation on chronic anticoagulation with Robaxin and #3 possible history of atrial thrombus by patient report. #4 history treatment pressure accident. #5 mitral valve annuloplasty/repair by Dr. Janey Genta at [facility] in 2003. #6 hypertension.  Social history the patient denies tobacco use.  Family history is notable for heart disease in his father.  Medications #1 diltiazem 180 mg daily. #2 Lopressor 75 mg twice a day #3  Pradaxa  Past Medical History  Diagnosis Date  . Coronary artery disease   . Stroke   . Atrial fibrillation   . Atrial thrombus   . Hypertension   . B12 deficiency   . Folic acid deficiency     History   Social History  . Marital Status: Widowed    Spouse Name: N/A    Number of Children: N/A  . Years of Education: N/A   Occupational History  . Not on file.   Social History Main Topics  . Smoking status: Former Games developer  . Smokeless tobacco: Never Used  . Alcohol Use: No  . Drug Use: No  . Sexually Active: No   Other Topics Concern  . Not on file   Social History Narrative  . No narrative on file      Medications Prior to Admission  Medication Dose Route Frequency Provider Last Rate Last Dose  . dextrose 5 % and 0.45 % NaCl with KCl 10 mEq/L infusion   Intravenous Continuous Hind I. Elsaid 100 mL/hr at 03/08/11 0101    . diltiazem (CARDIZEM CD) 24 hr capsule 180 mg  180 mg Oral Daily Gary Fleet Abbott, PHARMD      . HYDROmorphone (DILAUDID) injection 0.5 mg  0.5 mg Intravenous Once Hilario Quarry, MD   0.5 mg at 03/07/11 1034  . HYDROmorphone (DILAUDID) injection 0.5 mg  0.5 mg Intravenous Once Hilario Quarry, MD   0.5 mg at 03/07/11 1434  . iohexol (OMNIPAQUE) 300 MG/ML injection 100 mL  100 mL Intravenous Once PRN Medication Radiologist   100 mL at 03/07/11 1141  . lidocaine (XYLOCAINE) 2 % jelly        0.125 application at 03/07/11 1434  . metoprolol (LOPRESSOR) injection 2.5 mg  2.5 mg Intravenous Q6H  Hind I. Elsaid   2.5 mg at 03/08/11 0053  . metoprolol (LOPRESSOR) injection 5 mg  5 mg Intravenous Once Hind I. Elsaid   5 mg at 03/08/11 0049  . ondansetron (ZOFRAN) injection 4 mg  4 mg Intravenous Once Hilario Quarry, MD   4 mg at 03/07/11 1034  . ondansetron (ZOFRAN) injection 4 mg  4 mg Intravenous Once Hilario Quarry, MD   4 mg at 03/07/11 1251  . sodium chloride 0.9 % bolus 1,000 mL  1,000 mL Intravenous Once Hilario Quarry, MD   1,000 mL at 03/07/11 1034   . DISCONTD: diltiazem (TIAZAC) 24 hr capsule 180 mg  180 mg Oral Daily Hind I. Elsaid      . DISCONTD: HYDROmorphone (DILAUDID) 1 MG/ML injection            No current outpatient prescriptions on file as of 03/08/2011.    No Known Allergies  Review of Systems: All systems reviewed and are negative except as mentioned above in the history of present illness.    PHYSICAL EXAM: Filed Vitals:   03/07/11 2141  BP: 128/85  Pulse: 102  Temp: 99.1 F (37.3 C)  Resp: 18   GENERAL: No acute distress.   HEENT: Normocephalic, atraumatic.  Oropharynx is pink and moist without lesions.  NECK: Supple, no LAD, no JVD, no masses. CV: Irreg Irreg, 2/6 SEM at base LUNGS: Clear to auscultation bilaterally.   ABDOMEN: No BS, mildly TTP, distended EXTREMITIES: No clubbing, cyanosis, or edema.   NEURO: AO x 3, no focal deficits. PYSCH: Normal affect. SKIN: No rashes.     ECG: AF with RVR with VR of 120, RBBB, slightly prominent TWI in V3-4.  Results for orders placed during the hospital encounter of 03/07/11 (from the past 24 hour(s))  CBC     Status: Abnormal   Collection Time   03/07/11  9:40 AM      Component Value Range   WBC 12.5 (*) 4.0 - 10.5 (K/uL)   RBC 4.96  4.22 - 5.81 (MIL/uL)   Hemoglobin 15.4  13.0 - 17.0 (g/dL)   HCT 16.1  09.6 - 04.5 (%)   MCV 89.1  78.0 - 100.0 (fL)   MCH 31.0  26.0 - 34.0 (pg)   MCHC 34.8  30.0 - 36.0 (g/dL)   RDW 40.9  81.1 - 91.4 (%)   Platelets 199  150 - 400 (K/uL)  DIFFERENTIAL     Status: Abnormal   Collection Time   03/07/11  9:40 AM      Component Value Range   Neutrophils Relative 78 (*) 43 - 77 (%)   Neutro Abs 9.7 (*) 1.7 - 7.7 (K/uL)   Lymphocytes Relative 11 (*) 12 - 46 (%)   Lymphs Abs 1.4  0.7 - 4.0 (K/uL)   Monocytes Relative 10  3 - 12 (%)   Monocytes Absolute 1.3 (*) 0.1 - 1.0 (K/uL)   Eosinophils Relative 1  0 - 5 (%)   Eosinophils Absolute 0.1  0.0 - 0.7 (K/uL)   Basophils Relative 0  0 - 1 (%)   Basophils Absolute 0.0  0.0 -  0.1 (K/uL)  COMPREHENSIVE METABOLIC PANEL     Status: Abnormal   Collection Time   03/07/11  9:40 AM      Component Value Range   Sodium 140  135 - 145 (mEq/L)   Potassium 4.5  3.5 - 5.1 (mEq/L)   Chloride 101  96 - 112 (mEq/L)  CO2 30  19 - 32 (mEq/L)   Glucose, Bld 119 (*) 70 - 99 (mg/dL)   BUN 25 (*) 6 - 23 (mg/dL)   Creatinine, Ser 1.61  0.50 - 1.35 (mg/dL)   Calcium 09.6 (*) 8.4 - 10.5 (mg/dL)   Total Protein 7.9  6.0 - 8.3 (g/dL)   Albumin 4.2  3.5 - 5.2 (g/dL)   AST 25  0 - 37 (U/L)   ALT 13  0 - 53 (U/L)   Alkaline Phosphatase 68  39 - 117 (U/L)   Total Bilirubin 0.5  0.3 - 1.2 (mg/dL)   GFR calc non Af Amer 69 (*) >90 (mL/min)   GFR calc Af Amer 80 (*) >90 (mL/min)  LIPASE, BLOOD     Status: Normal   Collection Time   03/07/11  9:40 AM      Component Value Range   Lipase 25  11 - 59 (U/L)  PROTIME-INR     Status: Abnormal   Collection Time   03/07/11  9:40 AM      Component Value Range   Prothrombin Time 15.4 (*) 11.6 - 15.2 (seconds)   INR 1.19  0.00 - 1.49   APTT     Status: Abnormal   Collection Time   03/07/11  9:40 AM      Component Value Range   aPTT 43 (*) 24 - 37 (seconds)  URINALYSIS, ROUTINE W REFLEX MICROSCOPIC     Status: Abnormal   Collection Time   03/07/11 12:18 PM      Component Value Range   Color, Urine YELLOW  YELLOW    APPearance CLEAR  CLEAR    Specific Gravity, Urine 1.044 (*) 1.005 - 1.030    pH 6.0  5.0 - 8.0    Glucose, UA NEGATIVE  NEGATIVE (mg/dL)   Hgb urine dipstick MODERATE (*) NEGATIVE    Bilirubin Urine NEGATIVE  NEGATIVE    Ketones, ur NEGATIVE  NEGATIVE (mg/dL)   Protein, ur NEGATIVE  NEGATIVE (mg/dL)   Urobilinogen, UA 0.2  0.0 - 1.0 (mg/dL)   Nitrite NEGATIVE  NEGATIVE    Leukocytes, UA NEGATIVE  NEGATIVE   URINE MICROSCOPIC-ADD ON     Status: Abnormal   Collection Time   03/07/11 12:18 PM      Component Value Range   Squamous Epithelial / LPF RARE  RARE    WBC, UA 0-2  <3 (WBC/hpf)   RBC / HPF 11-20  <3 (RBC/hpf)    Bacteria, UA FEW (*) RARE   CARDIAC PANEL(CRET KIN+CKTOT+MB+TROPI)     Status: Normal   Collection Time   03/08/11 12:25 AM      Component Value Range   Total CK 37  7 - 232 (U/L)   CK, MB 2.2  0.3 - 4.0 (ng/mL)   Troponin I <0.30  <0.30 (ng/mL)   Relative Index RELATIVE INDEX IS INVALID  0.0 - 2.5    Ct Abdomen Pelvis W Contrast  03/07/2011  *RADIOLOGY REPORT*  Clinical Data: Abdominal pain.  Coronary artery disease. Cholecystectomy.  CT ABDOMEN AND PELVIS WITH CONTRAST  Technique:  Multidetector CT imaging of the abdomen and pelvis was performed following the standard protocol during bolus administration of intravenous contrast.  Contrast: OMNIPAQUE IOHEXOL 300 MG/ML IV SOLN  Comparison: None  Findings: There is a reticular pattern at the lung bases consistent with mild interstitial lung disease.  No focal consolidation. Heart is enlarged.  No pericardial fluid.  There is no focal hepatic lesion.  There is mild intrahepatic biliary duct dilatation likely related to prior cholecystectomy. Pancreas, spleen, and adrenal glands are normal.  There is severe cortical thinning of the left kidney which may relate to prior infarction.  There is simple cyst of the left kidney.  There is cortical scarring of the right kidney additionally.  Several simple right renal cysts are present.  The stomach is moderately distended with the oral contrast.  The duodenum is not dilated.  The third portion the duodenum does not cross the spine and turns back towards the right upper quadrant (image 38).  The majority of the small bowel is in the right upper quadrant.  These findings are consistent with malrotation.  There are dilated loops of proximal small bowel up to 4 cm.  The distal ileum is collapsed at 10 mm.  There is a long segment of collapsed ileum.  There is no clear transition point and  there are multiple dilated loops of proximal small bowel.  There is enhancement within the bowel wall and prominence of the  vessels within the mesentery. No significant bowel wall thickening.  No portal venous gas.  The colon is predominately collapsed without evidence of mass lesion.  There are diverticula in the sigmoid colon which is severe.  Stool ball in the rectum.  Abdominal aorta normal caliber.  There is no evidence of portal venous gas.  No evidence of intraperitoneal free air.  No free fluid the pelvis.  The bladder prostate gland are normal. Review of  bone windows demonstrates no aggressive osseous lesions.  IMPRESSION:  1.  Findings consistent with early mechanical small bowel obstruction.  Etiology would include adhesions or malrotation. 2.  The patient has malrotated small bowel anatomy. 3.  Enhancement of the small bowel mucosa with vascular engorgement.  No  portal venous gas or significant bowel wall thickening.  Recommend close observation for ischemia.  4.  Severe sigmoid diverticulosis without diverticulitis. 5.  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  Findings discussed Dr. Rosalia Hammers on 03/07/2011 at 12:20 p.m.  Original Report Authenticated By: Genevive Bi, M.D.   Dg Chest Port 1 View  03/07/2011  *RADIOLOGY REPORT*  Clinical Data: Nasogastric placement  PORTABLE CHEST - 1 VIEW  Comparison: None.  Findings: Nasogastric tube enters the stomach.  The lungs show abnormal interstitial markings that could be due to chronic scarring or interstitial edema.  IMPRESSION: Nasogastric tube enters the stomach.  Abnormal interstitial pulmonary density could be scarring or edema.  Original Report Authenticated By: Thomasenia Sales, M.D.     ASSESSMENT and PLAN: 77 year old white male with a past medical history significant for atrial fibrillation on chronic anticoagulation, coronary artery disease, status post coronary artery bypass grafting and mitral valve repair, who presents for cardiac consultation in the setting of small bowel obstruction.  #1 Atrial fibrillation. His rate is mildly elevated and  currently is ranging between ventricular rates of 100-120. I suspect that some of his tachycardia is related to the pain and his acute illness with a small bowel obstruction. I suspect that when this obstruction resolves, his heart rate will improve. However, we will increase his diltiazem to 60 mg 4 times a day as this will help with some of his ventricular rates. We would also recommend he remain on Lopressor 75 mg twice a day. If this does not control his rate then we can increase the to diltiazem and Lopressor to higher doses. I think a reasonable rate control rate would be a  ventricular rhythm ls than 100. It is unclear whether or not he will need surgery, so we will discontinue his pradaxa and recommend placing him on a heparin drip for systemic anticoagulation. It should be noted that he should remain on chronic anticoagulation in the hospital given his elevated CHADS2 score due to his age, hypertension, previous history of stroke.  #2 Coronary artery disease as his coronary artery bypass grafting. The patient is not on aspirin or statin therapy at home. I feel he would benefit from initiation of these medications and we'll start on aspirin 81 mg daily, as well as on statin therapy. He should continue his beta blocker.  #3 Mitral valve repair. We'll check an echocardiogram today. The patient has a systolic ejection murmur which I suspect is related to mild aortic sclerosis.  However, we will also be able to see the mitral valve function with echocardiogram.  Thank for allowing [facility] to participate in the care of this patient. We'll follow along with you in consultation

## 2011-03-08 NOTE — Progress Notes (Signed)
ANTICOAGULATION CONSULT NOTE - Follow Up Consult  Pharmacy Consult for Heparin Indication: Atrial fibrillation, hx Atrial Thrombus  No Known Allergies  Patient Measurements: Height: 5\' 7"  (170.2 cm) Weight: 144 lb 2.9 oz (65.4 kg) IBW/kg (Calculated) : 66.1    Vital Signs: Temp: 99.5 F (37.5 C) (12/04 2100) Temp src: Oral (12/04 2100) BP: 125/81 mmHg (12/04 2100) Pulse Rate: 118  (12/04 2100)  Labs:  Basename 03/08/11 2101 03/08/11 1857 03/08/11 0800 03/08/11 0749 03/08/11 0515 03/08/11 0025 03/07/11 0940  HGB -- -- -- -- 14.1 -- 15.4  HCT -- -- -- -- 41.2 -- 44.2  PLT -- -- -- -- 173 -- 199  APTT -- -- -- -- -- -- 43*  LABPROT -- -- -- -- 17.9* -- 15.4*  INR -- -- -- -- 1.45 -- 1.19  HEPARINUNFRC 0.75* -- 0.28* -- -- -- --  CREATININE -- -- -- -- 1.01 -- 1.00  CKTOTAL -- 38 -- 32 -- 37 --  CKMB -- 2.4 -- 2.1 -- 2.2 --  TROPONINI -- <0.30 -- <0.30 -- <0.30 --   Estimated Creatinine Clearance: 54.9 ml/min (by C-G formula based on Cr of 1.01).   Medications:  Scheduled:     . heparin  2,000 Units Intravenous Once  . metoprolol  5 mg Intravenous Once  . metoprolol  5 mg Intravenous Q4H  . pantoprazole (PROTONIX) IV  40 mg Intravenous Q12H  . DISCONTD: aspirin  325 mg Oral Daily  . DISCONTD: diltiazem  180 mg Oral Daily  . DISCONTD: diltiazem  60 mg Oral Q6H  . DISCONTD: diltiazem  180 mg Oral Daily  . DISCONTD: metoprolol  2.5 mg Intravenous Q6H  . DISCONTD: rosuvastatin  20 mg Oral Daily   Infusions:     . dextrose 5 % and 0.45 % NaCl with KCl 10 mEq/L 100 mL/hr at 03/08/11 1332  . heparin 1,200 Units/hr (03/08/11 1332)    Assessment: 77 yo male with h/o atrial fibrillation and atrial thrombus. On Pradaxa PTA, but currently on IV heparin infusion.  Heparin level is above-goal.    Goal of Therapy:  Heparin level 0.3-0.7 units/ml   Plan:  1. Decrease IV heparin to 1100 units/hr.  2. F/u heparin level in AM.   Emeline Gins 03/08/2011 10:21 PM

## 2011-03-08 NOTE — Consult Note (Signed)
Reason for Consult:possible bowel obstruction Referring Physician: Dr. Heywood Footman is an 77 y.o. male.  HPI: the patient is a 77 year old white male who has been experiencing some mild abdominal discomfort for the last week. Today he developed nausea and vomiting. His abdominal discomfort worsened. He apparently went to [place] [facility] where a CT scan was obtained. There was some question of malrotation of the small bowel. There was some dilated small bowel. No particular transition point could be identified. There was no evidence of abscess or free air. He was then transferred to [facility] for admission. After arriving here he had a large bowel movement and feels better.  Past Medical History  Diagnosis Date  . Coronary artery disease   . Stroke   . Atrial fibrillation   . Atrial thrombus   . Hypertension   . B12 deficiency   . Folic acid deficiency     Past Surgical History  Procedure Date  . Inguinal hernia repair   . Coronary artery bypass graft     1993  . Mitral valve anuloplasty   . Cholecystectomy   . Ankle fracture surgery   . Appendectomy   . Abdominal adhesion surgery     History reviewed. No pertinent family history.  Social History:  reports that he has quit smoking. He has never used smokeless tobacco. He reports that he does not drink alcohol or use illicit drugs.  Allergies: No Known Allergies  Medications: I have reviewed the patient's current medications.  Results for orders placed during the hospital encounter of 03/07/11 (from the past 48 hour(s))  CBC     Status: Abnormal   Collection Time   03/07/11  9:40 AM      Component Value Range Comment   WBC 12.5 (*) 4.0 - 10.5 (K/uL)    RBC 4.96  4.22 - 5.81 (MIL/uL)    Hemoglobin 15.4  13.0 - 17.0 (g/dL)    HCT 16.1  09.6 - 04.5 (%)    MCV 89.1  78.0 - 100.0 (fL)    MCH 31.0  26.0 - 34.0 (pg)    MCHC 34.8  30.0 - 36.0 (g/dL)    RDW 40.9  81.1 - 91.4 (%)    Platelets 199  150 - 400 (K/uL)     DIFFERENTIAL     Status: Abnormal   Collection Time   03/07/11  9:40 AM      Component Value Range Comment   Neutrophils Relative 78 (*) 43 - 77 (%)    Neutro Abs 9.7 (*) 1.7 - 7.7 (K/uL)    Lymphocytes Relative 11 (*) 12 - 46 (%)    Lymphs Abs 1.4  0.7 - 4.0 (K/uL)    Monocytes Relative 10  3 - 12 (%)    Monocytes Absolute 1.3 (*) 0.1 - 1.0 (K/uL)    Eosinophils Relative 1  0 - 5 (%)    Eosinophils Absolute 0.1  0.0 - 0.7 (K/uL)    Basophils Relative 0  0 - 1 (%)    Basophils Absolute 0.0  0.0 - 0.1 (K/uL)   COMPREHENSIVE METABOLIC PANEL     Status: Abnormal   Collection Time   03/07/11  9:40 AM      Component Value Range Comment   Sodium 140  135 - 145 (mEq/L)    Potassium 4.5  3.5 - 5.1 (mEq/L)    Chloride 101  96 - 112 (mEq/L)    CO2 30  19 - 32 (mEq/L)  Glucose, Bld 119 (*) 70 - 99 (mg/dL)    BUN 25 (*) 6 - 23 (mg/dL)    Creatinine, Ser 1.47  0.50 - 1.35 (mg/dL)    Calcium 82.9 (*) 8.4 - 10.5 (mg/dL)    Total Protein 7.9  6.0 - 8.3 (g/dL)    Albumin 4.2  3.5 - 5.2 (g/dL)    AST 25  0 - 37 (U/L)    ALT 13  0 - 53 (U/L)    Alkaline Phosphatase 68  39 - 117 (U/L)    Total Bilirubin 0.5  0.3 - 1.2 (mg/dL)    GFR calc non Af Amer 69 (*) >90 (mL/min)    GFR calc Af Amer 80 (*) >90 (mL/min)   LIPASE, BLOOD     Status: Normal   Collection Time   03/07/11  9:40 AM      Component Value Range Comment   Lipase 25  11 - 59 (U/L)   PROTIME-INR     Status: Abnormal   Collection Time   03/07/11  9:40 AM      Component Value Range Comment   Prothrombin Time 15.4 (*) 11.6 - 15.2 (seconds)    INR 1.19  0.00 - 1.49    APTT     Status: Abnormal   Collection Time   03/07/11  9:40 AM      Component Value Range Comment   aPTT 43 (*) 24 - 37 (seconds)   URINALYSIS, ROUTINE W REFLEX MICROSCOPIC     Status: Abnormal   Collection Time   03/07/11 12:18 PM      Component Value Range Comment   Color, Urine YELLOW  YELLOW     APPearance CLEAR  CLEAR     Specific Gravity, Urine 1.044 (*)  1.005 - 1.030     pH 6.0  5.0 - 8.0     Glucose, UA NEGATIVE  NEGATIVE (mg/dL)    Hgb urine dipstick MODERATE (*) NEGATIVE     Bilirubin Urine NEGATIVE  NEGATIVE     Ketones, ur NEGATIVE  NEGATIVE (mg/dL)    Protein, ur NEGATIVE  NEGATIVE (mg/dL)    Urobilinogen, UA 0.2  0.0 - 1.0 (mg/dL)    Nitrite NEGATIVE  NEGATIVE     Leukocytes, UA NEGATIVE  NEGATIVE    URINE MICROSCOPIC-ADD ON     Status: Abnormal   Collection Time   03/07/11 12:18 PM      Component Value Range Comment   Squamous Epithelial / LPF RARE  RARE     WBC, UA 0-2  <3 (WBC/hpf)    RBC / HPF 11-20  <3 (RBC/hpf)    Bacteria, UA FEW (*) RARE    CARDIAC PANEL(CRET KIN+CKTOT+MB+TROPI)     Status: Normal   Collection Time   03/08/11 12:25 AM      Component Value Range Comment   Total CK 37  7 - 232 (U/L)    CK, MB 2.2  0.3 - 4.0 (ng/mL)    Troponin I <0.30  <0.30 (ng/mL)    Relative Index RELATIVE INDEX IS INVALID  0.0 - 2.5      Ct Abdomen Pelvis W Contrast  03/07/2011  *RADIOLOGY REPORT*  Clinical Data: Abdominal pain.  Coronary artery disease. Cholecystectomy.  CT ABDOMEN AND PELVIS WITH CONTRAST  Technique:  Multidetector CT imaging of the abdomen and pelvis was performed following the standard protocol during bolus administration of intravenous contrast.  Contrast: OMNIPAQUE IOHEXOL 300 MG/ML IV SOLN  Comparison: None  Findings:  There is a reticular pattern at the lung bases consistent with mild interstitial lung disease.  No focal consolidation. Heart is enlarged.  No pericardial fluid.  There is no focal hepatic lesion.  There is mild intrahepatic biliary duct dilatation likely related to prior cholecystectomy. Pancreas, spleen, and adrenal glands are normal.  There is severe cortical thinning of the left kidney which may relate to prior infarction.  There is simple cyst of the left kidney.  There is cortical scarring of the right kidney additionally.  Several simple right renal cysts are present.  The stomach is  moderately distended with the oral contrast.  The duodenum is not dilated.  The third portion the duodenum does not cross the spine and turns back towards the right upper quadrant (image 38).  The majority of the small bowel is in the right upper quadrant.  These findings are consistent with malrotation.  There are dilated loops of proximal small bowel up to 4 cm.  The distal ileum is collapsed at 10 mm.  There is a long segment of collapsed ileum.  There is no clear transition point and  there are multiple dilated loops of proximal small bowel.  There is enhancement within the bowel wall and prominence of the vessels within the mesentery. No significant bowel wall thickening.  No portal venous gas.  The colon is predominately collapsed without evidence of mass lesion.  There are diverticula in the sigmoid colon which is severe.  Stool ball in the rectum.  Abdominal aorta normal caliber.  There is no evidence of portal venous gas.  No evidence of intraperitoneal free air.  No free fluid the pelvis.  The bladder prostate gland are normal. Review of  bone windows demonstrates no aggressive osseous lesions.  IMPRESSION:  1.  Findings consistent with early mechanical small bowel obstruction.  Etiology would include adhesions or malrotation. 2.  The patient has malrotated small bowel anatomy. 3.  Enhancement of the small bowel mucosa with vascular engorgement.  No  portal venous gas or significant bowel wall thickening.  Recommend close observation for ischemia.  4.  Severe sigmoid diverticulosis without diverticulitis. 5.  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  Findings discussed Dr. Rosalia Hammers on 03/07/2011 at 12:20 p.m.  Original Report Authenticated By: Genevive Bi, M.D.   Dg Chest Port 1 View  03/07/2011  *RADIOLOGY REPORT*  Clinical Data: Nasogastric placement  PORTABLE CHEST - 1 VIEW  Comparison: None.  Findings: Nasogastric tube enters the stomach.  The lungs show abnormal interstitial markings  that could be due to chronic scarring or interstitial edema.  IMPRESSION: Nasogastric tube enters the stomach.  Abnormal interstitial pulmonary density could be scarring or edema.  Original Report Authenticated By: Thomasenia Sales, M.D.    Review of Systems  Constitutional: Negative.   HENT: Negative.   Eyes: Negative.   Respiratory: Negative.   Cardiovascular: Negative.   Gastrointestinal: Positive for nausea and vomiting.  Genitourinary: Negative.   Musculoskeletal: Negative.   Skin: Negative.   Neurological: Negative.   Endo/Heme/Allergies: Negative.   Psychiatric/Behavioral: Negative.    Blood pressure 128/85, pulse 102, temperature 99.1 F (37.3 C), temperature source Oral, resp. rate 18, height 5\' 7"  (1.702 m), weight 143 lb 15.4 oz (65.3 kg), SpO2 93.00%. Physical Exam  Constitutional: He is oriented to person, place, and time. He appears well-developed and well-nourished.  HENT:  Head: Normocephalic and atraumatic.  Eyes: Conjunctivae and EOM are normal. Pupils are equal, round, and reactive to light.  Neck: Normal range of motion. Neck supple.  Cardiovascular:       Irregular rate and rhythm  Respiratory: Effort normal and breath sounds normal.  GI: Soft. Bowel sounds are normal. He exhibits distension. There is no tenderness.  Musculoskeletal: Normal range of motion.  Neurological: He is alert and oriented to person, place, and time.  Skin: Skin is warm and dry.  Psychiatric: He has a normal mood and affect. His behavior is normal.    Assessment/Plan: Possible partial bowel obstruction. The CT scan also raises the question of a possible malrotation. He has had several abdominal operations already. He already feels better after having a large bowel movement. I would agree with NG tube decompression of the intestines. I would plan to repeat his abdominal x-rays tomorrow and we will follow him closely with you.  TOTH III,PAUL S 03/08/2011, 1:45 AM

## 2011-03-08 NOTE — Progress Notes (Signed)
ANTICOAGULATION CONSULT NOTE - Follow Up Consult  Pharmacy Consult for Heparin Indication: A-fib, hx Atrial Thrombus  No Known Allergies  Patient Measurements: Height: 5\' 7"  (170.2 cm) Weight: 144 lb 2.9 oz (65.4 kg) IBW/kg (Calculated) : 66.1    Vital Signs: Temp: 98.6 F (37 C) (12/04 1029) Temp src: Oral (12/04 1029) BP: 133/79 mmHg (12/04 1029) Pulse Rate: 95  (12/04 1029)  Labs:  Basename 03/08/11 0800 03/08/11 0749 03/08/11 0515 03/08/11 0025 03/07/11 0940  HGB -- -- 14.1 -- 15.4  HCT -- -- 41.2 -- 44.2  PLT -- -- 173 -- 199  APTT -- -- -- -- 43*  LABPROT -- -- 17.9* -- 15.4*  INR -- -- 1.45 -- 1.19  HEPARINUNFRC 0.28* -- -- -- --  CREATININE -- -- 1.01 -- 1.00  CKTOTAL -- 32 -- 37 --  CKMB -- 2.1 -- 2.2 --  TROPONINI -- <0.30 -- <0.30 --   Estimated Creatinine Clearance: 54.9 ml/min (by C-G formula based on Cr of 1.01).   Medications:  Scheduled:    . heparin  2,000 Units Intravenous Once  .  HYDROmorphone (DILAUDID) injection  0.5 mg Intravenous Once  . lidocaine      . metoprolol  5 mg Intravenous Once  . metoprolol  5 mg Intravenous Q4H  . pantoprazole (PROTONIX) IV  40 mg Intravenous Q12H  . DISCONTD: aspirin  325 mg Oral Daily  . DISCONTD: diltiazem  180 mg Oral Daily  . DISCONTD: diltiazem  60 mg Oral Q6H  . DISCONTD: diltiazem  180 mg Oral Daily  . DISCONTD: HYDROmorphone      . DISCONTD: metoprolol  2.5 mg Intravenous Q6H  . DISCONTD: rosuvastatin  20 mg Oral Daily   Infusions:    . dextrose 5 % and 0.45 % NaCl with KCl 10 mEq/L 100 mL/hr at 03/08/11 0101  . heparin 1,000 Units/hr (03/08/11 9147)    Assessment: Patient is a 77 y/o male with history of A-fib and Atrial thrombus, admitted with a SBO.  He takes Pradaxa at home which has been d/c'd on admission.  He is on Heparin infusion.  Heparin level slightly subtherapeutic.    Goal of Therapy:  Heparin level 0.3-0.7 units/ml   Plan:  Increase Heparin infusion to 1200 units/hr. Next  Heparin level in 8 hours.  Rilyn Scroggs, Elisha Headland, Pharm.D. 03/08/2011 1:01 PM

## 2011-03-08 NOTE — Progress Notes (Signed)
Subjective:  The patient feels better this am.  Abdominal pain has subsided. Atrial fib chronic with VR in 80s now.  No chest pain.  Objective:  Vital Signs in the last 24 hours: Temp:  [98.5 F (36.9 C)-99.1 F (37.3 C)] 98.9 F (37.2 C) (12/04 0512) Pulse Rate:  [78-121] 101  (12/04 0512) Resp:  [18-20] 18  (12/04 0512) BP: (122-142)/(74-91) 132/74 mmHg (12/04 0553) SpO2:  [93 %-100 %] 95 % (12/04 0512) Weight:  [143 lb 15.4 oz (65.3 kg)-144 lb 2.9 oz (65.4 kg)] 144 lb 2.9 oz (65.4 kg) (12/04 0512)  Intake/Output from previous day: 12/03 0701 - 12/04 0700 In: 697 [P.O.:60; I.V.:637] Out: 950 [Urine:250; Emesis/NG output:700] Intake/Output from this shift:       . aspirin  325 mg Oral Daily  . diltiazem  60 mg Oral Q6H  . heparin  2,000 Units Intravenous Once  .  HYDROmorphone (DILAUDID) injection  0.5 mg Intravenous Once  .  HYDROmorphone (DILAUDID) injection  0.5 mg Intravenous Once  . lidocaine      . metoprolol  5 mg Intravenous Once  . ondansetron (ZOFRAN) IV  4 mg Intravenous Once  . ondansetron (ZOFRAN) IV  4 mg Intravenous Once  . rosuvastatin  20 mg Oral Daily  . sodium chloride  1,000 mL Intravenous Once  . DISCONTD: diltiazem  180 mg Oral Daily  . DISCONTD: diltiazem  180 mg Oral Daily  . DISCONTD: HYDROmorphone      . DISCONTD: metoprolol  2.5 mg Intravenous Q6H      . dextrose 5 % and 0.45 % NaCl with KCl 10 mEq/L 100 mL/hr at 03/08/11 0101  . heparin 1,000 Units/hr (03/08/11 1308)    Physical Exam: The patient appears to be in no distress.  Head and neck exam reveals that the pupils are equal and reactive.  The extraocular movements are full.  There is no scleral icterus.  Mouth and pharynx are benign.  No lymphadenopathy.  No carotid bruits.  The jugular venous pressure is normal.  Thyroid is not enlarged or tender.  Chest is clear to percussion and auscultation.  No rales or rhonchi.  Expansion of the chest is symmetrical.  Heart reveals no  abnormal lift or heave.  First and second heart sounds are normal.  There is a 2/6 diastolic murmur suggestive of AI along LSB.  The abdomen is mildly distended.  Soft bowel sounds.  There is no hepatosplenomegaly or mass.  There are no abdominal bruits.  Extremities reveal no phlebitis or edema.  Pedal pulses are good.  There is no cyanosis or clubbing.  Neurologic exam is normal strength and no lateralizing weakness.  No sensory deficits.  Integument reveals no rash  Lab Results:  Basename 03/08/11 0515 03/07/11 0940  WBC 12.9* 12.5*  HGB 14.1 15.4  PLT 173 199    Basename 03/08/11 0515 03/07/11 0940  NA 138 140  K 4.3 4.5  CL 100 101  CO2 27 30  GLUCOSE 128* 119*  BUN 19 25*  CREATININE 1.01 1.00    Basename 03/08/11 0025  TROPONINI <0.30   Hepatic Function Panel  Basename 03/08/11 0515  PROT 6.8  ALBUMIN 3.2*  AST 19  ALT 11  ALKPHOS 59  BILITOT 0.7  BILIDIR --  IBILI --   No results found for this basename: CHOL in the last 72 hours No results found for this basename: PROTIME in the last 72 hours  Imaging: Imaging results have been reviewed  Cardiac  Studies:  Assessment/Plan:  Patient Active Problem List  Diagnoses  . A-fib  . CAD (coronary artery disease)  . S/P CABG (coronary artery bypass graft)  . HTN (hypertension)  . S/P MVR (mitral valve repair)       Plan:  Continue IV heparin and off Pradaxa until we know surgery is not needed. 2D echo today to evaluate MV repair and evaluate aortic murmur. Continue rate control for chronic atrial fib.  LOS: 1 day    Cassell Clement 03/08/2011, 9:27 AM

## 2011-03-09 ENCOUNTER — Inpatient Hospital Stay (HOSPITAL_COMMUNITY): Payer: Medicare Other

## 2011-03-09 LAB — HEPARIN LEVEL (UNFRACTIONATED)
Heparin Unfractionated: 0.71 IU/mL — ABNORMAL HIGH (ref 0.30–0.70)
Heparin Unfractionated: 0.61 IU/mL (ref 0.30–0.70)

## 2011-03-09 LAB — BASIC METABOLIC PANEL
BUN: 17 mg/dL (ref 6–23)
Calcium: 8.5 mg/dL (ref 8.4–10.5)
Calcium: 8.5 mg/dL (ref 8.4–10.5)
Chloride: 102 mEq/L (ref 96–112)
Creatinine, Ser: 0.95 mg/dL (ref 0.50–1.35)
GFR calc Af Amer: 72 mL/min — ABNORMAL LOW (ref >90)
GFR calc Af Amer: 89 mL/min — ABNORMAL LOW (ref >90)
GFR calc non Af Amer: 62 mL/min — ABNORMAL LOW (ref >90)
GFR calc non Af Amer: 77 mL/min — ABNORMAL LOW (ref >90)
Potassium: 4.2 mEq/L (ref 3.5–5.1)

## 2011-03-09 LAB — CBC
HCT: 39.4 % (ref 39.0–52.0)
Hemoglobin: 13.4 g/dL (ref 13.0–17.0)
MCH: 31.3 pg (ref 26.0–34.0)
MCH: 31.4 pg (ref 26.0–34.0)
MCHC: 33.8 g/dL (ref 30.0–36.0)
MCHC: 34.1 g/dL (ref 30.0–36.0)
Platelets: 160 10*3/uL (ref 150–400)
RDW: 12.8 % (ref 11.5–15.5)
RDW: 12.6 % (ref 11.5–15.5)

## 2011-03-09 MED ORDER — SODIUM CHLORIDE 0.9 % IV SOLN
Freq: Every day | INTRAVENOUS | Status: DC | PRN
  Administered 2011-03-10: via INTRAVENOUS

## 2011-03-09 MED ORDER — FUROSEMIDE 10 MG/ML IJ SOLN
40.0000 mg | Freq: Once | INTRAMUSCULAR | Status: AC
  Administered 2011-03-09: 40 mg via INTRAVENOUS
  Filled 2011-03-09 (×2): qty 4

## 2011-03-09 MED ORDER — DILTIAZEM HCL ER COATED BEADS 180 MG PO CP24
180.0000 mg | ORAL_CAPSULE | Freq: Every day | ORAL | Status: DC
Start: 2011-03-09 — End: 2011-03-12
  Administered 2011-03-09 – 2011-03-12 (×4): 180 mg via ORAL
  Filled 2011-03-09 (×4): qty 1

## 2011-03-09 MED ORDER — DILTIAZEM HCL ER BEADS 180 MG PO CP24: 180.0000 mg | ORAL_CAPSULE | Freq: Every day | ORAL | Status: DC

## 2011-03-09 NOTE — Clinical Documentation Improvement (Signed)
CHF DOCUMENTATION QUERY  THIS DOCUMENT IS NOT A PERMANENT PART OF THE MEDICAL RECORD   Please update your documentation within the medical record to reflect your response to this query.                                                                                    03/09/11  Dr. Patty Sermons and/or Associates,  In a better effort to capture your patient's severity of illness, reflect appropriate length of stay and utilization of resources, a review of the patient medical record has revealed the following indicators for Heart Failure.    Based on your clinical judgment, please document if the patient was treated for:  - Acute Diastolic Heart Failure  Yes  - Other Condition  - Unable to Clinically Determine    Clinical Information:   Progress Note Dr. Patty Sermons 03/09/11 "Plan: Patient has developed rales at bases, is on NS 100cc/hr. Weight up 2 lb overnight. Will get chest xray and give Lasix 40mg  IV today."   Documented episodes of A-fib with RVR since admission.  CHEST - 2 VIEW 03/09/11 Comparison: 03/07/2011. "Pulmonary vascular congestion superimposed on chronic lung changes suspected. Follow-up until clearance recommended."   Echo 03/08/11 Left ventricle: The cavity size was normal. Wall thickness was increased in a pattern of mild LVH. Systolic function was normal. The estimated ejection fraction was in the range of 55% to 60%. Indeterminant diastolic function. Wall motion was normal; there were no regional wall motion abnormalities. Right ventricle: Poorly visualized. The cavity size was mildly to moderately dilated. Systolic function was mildly reduced. Pulmonic valve: Mild to moderate regurgitation. Tricuspid valve: Doppler: Mild-moderate regurgitation.   Reviewed:   Thank You,  Jerral Ralph RN BSN Certified Clinical Documentation Specialist: Cell  [phone]  [facility] Information Management [place]  In responding to this query please exercise your  independent judgment.  The fact that a query is asked, does not imply that any particular answer is desired or expected.   TO RESPOND TO THE THIS QUERY, FOLLOW THE INSTRUCTIONS BELOW:  1. If needed, update documentation for the patient's encounter via the notes activity.  2. Access this query again and click edit on the [employer].  3. After updating, or not, click F2 to complete all highlighted (required) fields concerning your review. Select "additional documentation in the medical record" OR "no additional documentation provided".  4. Click Sign note button.  5. The deficiency will fall out of your InBasket *Please let us know if you are not able to compete this workflow by phone or e-mail (listed above).

## 2011-03-09 NOTE — Progress Notes (Signed)
ANTICOAGULATION CONSULT NOTE - Follow Up Consult  Pharmacy Consult for Heparin Indication: atrial fibrillation, hx atrial thrombus  No Known Allergies  Patient Measurements: Height: 5\' 7"  (170.2 cm) Weight: 146 lb (66.225 kg) (scale a ) IBW/kg (Calculated) : 66.1  :    Vital Signs: Temp: 97.6 F (36.4 C) (12/05 0500) Temp src: Oral (12/05 0500) BP: 122/84 mmHg (12/05 0500) Pulse Rate: 113  (12/05 0500)  Labs:  Basename 03/09/11 0540 03/08/11 2101 03/08/11 1857 03/08/11 0800 03/08/11 0749 03/08/11 0515 03/08/11 0025 03/07/11 0940  HGB 13.3 -- -- -- -- 14.1 -- --  HCT 39.4 -- -- -- -- 41.2 -- 44.2  PLT 150 -- -- -- -- 173 -- 199  APTT -- -- -- -- -- -- -- 43*  LABPROT -- -- -- -- -- 17.9* -- 15.4*  INR -- -- -- -- -- 1.45 -- 1.19  HEPARINUNFRC 0.71* 0.75* -- 0.28* -- -- -- --  CREATININE 1.10 -- -- -- -- 1.01 -- 1.00  CKTOTAL -- -- 38 -- 32 -- 37 --  CKMB -- -- 2.4 -- 2.1 -- 2.2 --  TROPONINI -- -- <0.30 -- <0.30 -- <0.30 --   Estimated Creatinine Clearance: 50.9 ml/min (by C-G formula based on Cr of 1.1).   Medications:  Scheduled:    . furosemide  40 mg Intravenous Once  . metoprolol  5 mg Intravenous Q4H  . pantoprazole (PROTONIX) IV  40 mg Intravenous Q12H  . DISCONTD: aspirin  325 mg Oral Daily  . DISCONTD: diltiazem  60 mg Oral Q6H  . DISCONTD: rosuvastatin  20 mg Oral Daily   Infusions:    . dextrose 5 % and 0.45 % NaCl with KCl 10 mEq/L 100 mL/hr at 03/09/11 0056  . heparin 1,100 Units/hr (03/09/11 0056)    Assessment: Heparin level slightly elevated  0.71   No complications noted  Goal of Therapy:  Heparin level 0.3-0.7 units/ml   Plan:  Decrease Heparin to 950 units/hr Next Heparin level, CBC in 8 hours.  Kayloni Rocco, Elisha Headland, Pharm.D. 03/09/2011 9:37 AM

## 2011-03-09 NOTE — Progress Notes (Signed)
Subjective:  Patient denies any chest pain or dyspnea.  Rhythm is atrial fib with rapid VR at times requiring IV metoprolol. Weight up 2 lb since yesterday  Objective:  Vital Signs in the last 24 hours: Temp:  [97.6 F (36.4 C)-99.5 F (37.5 C)] 97.6 F (36.4 C) (12/05 0500) Pulse Rate:  [95-118] 113  (12/05 0500) Resp:  [18-20] 18  (12/05 0500) BP: (100-145)/(68-84) 122/84 mmHg (12/05 0500) SpO2:  [90 %-98 %] 98 % (12/05 0500) Weight:  [146 lb (66.225 kg)] 146 lb (66.225 kg) (12/05 0500)  Intake/Output from previous day: 12/04 0701 - 12/05 0700 In: 2717.9 [P.O.:60; I.V.:2647.9; IV Piggyback:10] Out: 600 [Emesis/NG output:600] Intake/Output from this shift:       . metoprolol  5 mg Intravenous Q4H  . pantoprazole (PROTONIX) IV  40 mg Intravenous Q12H  . DISCONTD: aspirin  325 mg Oral Daily  . DISCONTD: diltiazem  60 mg Oral Q6H  . DISCONTD: rosuvastatin  20 mg Oral Daily      . dextrose 5 % and 0.45 % NaCl with KCl 10 mEq/L 100 mL/hr at 03/09/11 0056  . heparin 1,100 Units/hr (03/09/11 0056)    Physical Exam: The patient appears to be in no distress.  Head and neck exam reveals that the pupils are equal and reactive.  The extraocular movements are full.  There is no scleral icterus.  Mouth and pharynx are benign.  No lymphadenopathy.  No carotid bruits.  The jugular venous pressure is normal.  Thyroid is not enlarged or tender.  Chest reveals crackling rales at both bases.  Heart reveals no abnormal lift or heave.  First and second heart sounds are normal.  Moderate AI murmur   The abdomen is soft and nontender.  Bowel sounds are soft..  There is no hepatosplenomegaly or mass.  There are no abdominal bruits.  Extremities reveal no phlebitis or edema.  Pedal pulses are good.  There is no cyanosis or clubbing.  Neurologic exam is normal strength and no lateralizing weakness.  No sensory deficits.  Integument reveals no rash  Lab Results:  Basename 03/09/11 0540  03/08/11 0515  WBC 15.3* 12.9*  HGB 13.3 14.1  PLT 150 173    Basename 03/09/11 0540 03/08/11 0515  NA 135 138  K 4.2 4.3  CL 100 100  CO2 30 27  GLUCOSE 124* 128*  BUN 17 19  CREATININE 1.10 1.01    Basename 03/08/11 1857 03/08/11 0749  TROPONINI <0.30 <0.30   Hepatic Function Panel  Basename 03/08/11 0515  PROT 6.8  ALBUMIN 3.2*  AST 19  ALT 11  ALKPHOS 59  BILITOT 0.7  BILIDIR --  IBILI --   No results found for this basename: CHOL in the last 72 hours No results found for this basename: PROTIME in the last 72 hours * Imaging: Imaging results have been reviewed.  For repeat xray today of chest  Cardiac Studies: Echo shows normal LV systolic function. Mild-moderate AI Assessment/Plan:  Patient Active Problem List  Diagnoses  . A-fib  . CAD (coronary artery disease)  . S/P CABG (coronary artery bypass graft)  . HTN (hypertension)  . S/P MVR (mitral valve repair)  . SBO (small bowel obstruction)  Plan: Patient has developed rales at bases, is on NS 100cc/hr.  Weight up 2 lb overnight.  Will get chest xray and give Lasix 40mg  IV today.    LOS: 2 days    Jordan Blackwell 03/09/2011, 8:32 AM

## 2011-03-09 NOTE — Progress Notes (Signed)
PATIENT DETAILS Name: Jordan Blackwell Age: 77 y.o. Sex: male Date of Birth: 1930-03-14 Admit Date: 03/07/2011 [id number], [name], MD  Subjective: Better. NG tube now removed. Passing flatus.  Objective: Vital signs in last 24 hours: Filed Vitals:   03/08/11 1700 03/08/11 2100 03/09/11 0500 03/09/11 1300  BP: 100/68 125/81 122/84 121/74  Pulse: 113 118 113 83  Temp: 99 F (37.2 C) 99.5 F (37.5 C) 97.6 F (36.4 C) 98.4 F (36.9 C)  TempSrc: Oral Oral Oral   Resp: 20 20 18 18   Height:      Weight:   66.225 kg (146 lb)   SpO2: 98% 97% 98% 98%    Weight change: 0.454 kg (1 lb)  Body mass index is 22.87 kg/(m^2).  Intake/Output from previous day:  Intake/Output Summary (Last 24 hours) at 03/09/11 1557 Last data filed at 03/09/11 1523  Gross per 24 hour  Intake 2760.89 ml  Output   2075 ml  Net 685.89 ml    PHYSICAL EXAM: Gen Exam: Awake and alert with clear speech.   Neck: Supple, No JVD.   Chest: B/L Clear.   CVS: S1 S2 irregular, no murmurs.  Abdomen: soft, BS +, non tender, non distended.  Extremities: no edema, lower extremities warm to touch. Neurologic: Non Focal.   Skin: No Rash.   Wounds: N/A.    CONSULTS:  cardiology and general surgery  LAB RESULTS: CBC  Lab 03/09/11 0540 03/08/11 0515 03/07/11 0940  WBC 15.3* 12.9* 12.5*  HGB 13.3 14.1 15.4  HCT 39.4 41.2 44.2  PLT 150 173 199  MCV 92.7 92.0 89.1  MCH 31.3 31.5 31.0  MCHC 33.8 34.2 34.8  RDW 12.8 12.9 12.4  LYMPHSABS -- -- 1.4  MONOABS -- -- 1.3*  EOSABS -- -- 0.1  BASOSABS -- -- 0.0  BANDABS -- -- --    Chemistries   Lab 03/09/11 1120 03/09/11 0540 03/08/11 0515 03/07/11 0940  NA 134* 135 138 140  K 4.1 4.2 4.3 4.5  CL 102 100 100 101  CO2 25 30 27 30   GLUCOSE 128* 124* 128* 119*  BUN 15 17 19  25*  CREATININE 0.95 1.10 1.01 1.00  CALCIUM 8.5 8.5 8.7 10.6*  MG -- -- -- --    GFR Estimated Creatinine Clearance: 58.9 ml/min (by C-G formula based on Cr of  0.95).  Coagulation profile  Lab 03/08/11 0515 03/07/11 0940  INR 1.45 1.19  PROTIME -- --    Cardiac Enzymes  Lab 03/08/11 1857 03/08/11 0749 03/08/11 0025  CKMB 2.4 2.1 2.2  TROPONINI <0.30 <0.30 <0.30  MYOGLOBIN -- -- --    No results found for this basename: POCBNP:3 in the last 168 hours No results found for this basename: DDIMER:2 in the last 72 hours No results found for this basename: HGBA1C:2 in the last 72 hours No results found for this basename: CHOL:2,HDL:2,LDLCALC:2,TRIG:2,CHOLHDL:2,LDLDIRECT:2 in the last 72 hours No results found for this basename: TSH,T4TOTAL,FREET3,T3FREE,THYROIDAB in the last 72 hours No results found for this basename: VITAMINB12:2,FOLATE:2,FERRITIN:2,TIBC:2,IRON:2,RETICCTPCT:2 in the last 72 hours  Basename 03/07/11 0940  LIPASE 25  AMYLASE --    Urine Studies No results found for this basename: UACOL:2,UAPR:2,USPG:2,UPH:2,UTP:2,UGL:2,UKET:2,UBIL:2,UHGB:2,UNIT:2,UROB:2,ULEU:2,UEPI:2,UWBC:2,URBC:2,UBAC:2,CAST:2,CRYS:2,UCOM:2,BILUA:2 in the last 72 hours  MICROBIOLOGY: No results found for this or any previous visit (from the past 240 hour(s)).  RADIOLOGY STUDIES/RESULTS: Dg Chest 2 View  03/09/2011  *RADIOLOGY REPORT*  Clinical Data: Atrial fibrillation.  Cough. Shortness of breath.  CHEST - 2 VIEW  Comparison: 03/07/2011.  Findings: Nasogastric  tube side hole at the distal esophagus level with the tip just beyond the expected gastroesophageal junction. This may need to be advanced.  Pulmonary vascular congestion superimposed on chronic lung changes suspected.  Follow-up until clearance recommended.  Since the prior examination, there has been slight improved aeration of the left base.  Calcified mildly tortuous aorta.  IMPRESSION: Slight improvement in degree of aeration of the left base.  Nasogastric tube may need to be repositioned as discussed above.  Original Report Authenticated By: Fuller Canada, M.D.   Ct Abdomen Pelvis W  Contrast  03/07/2011  *RADIOLOGY REPORT*  Clinical Data: Abdominal pain.  Coronary artery disease. Cholecystectomy.  CT ABDOMEN AND PELVIS WITH CONTRAST  Technique:  Multidetector CT imaging of the abdomen and pelvis was performed following the standard protocol during bolus administration of intravenous contrast.  Contrast: OMNIPAQUE IOHEXOL 300 MG/ML IV SOLN  Comparison: None  Findings: There is a reticular pattern at the lung bases consistent with mild interstitial lung disease.  No focal consolidation. Heart is enlarged.  No pericardial fluid.  There is no focal hepatic lesion.  There is mild intrahepatic biliary duct dilatation likely related to prior cholecystectomy. Pancreas, spleen, and adrenal glands are normal.  There is severe cortical thinning of the left kidney which may relate to prior infarction.  There is simple cyst of the left kidney.  There is cortical scarring of the right kidney additionally.  Several simple right renal cysts are present.  The stomach is moderately distended with the oral contrast.  The duodenum is not dilated.  The third portion the duodenum does not cross the spine and turns back towards the right upper quadrant (image 38).  The majority of the small bowel is in the right upper quadrant.  These findings are consistent with malrotation.  There are dilated loops of proximal small bowel up to 4 cm.  The distal ileum is collapsed at 10 mm.  There is a long segment of collapsed ileum.  There is no clear transition point and  there are multiple dilated loops of proximal small bowel.  There is enhancement within the bowel wall and prominence of the vessels within the mesentery. No significant bowel wall thickening.  No portal venous gas.  The colon is predominately collapsed without evidence of mass lesion.  There are diverticula in the sigmoid colon which is severe.  Stool ball in the rectum.  Abdominal aorta normal caliber.  There is no evidence of portal venous gas.  No  evidence of intraperitoneal free air.  No free fluid the pelvis.  The bladder prostate gland are normal. Review of  bone windows demonstrates no aggressive osseous lesions.  IMPRESSION:  1.  Findings consistent with early mechanical small bowel obstruction.  Etiology would include adhesions or malrotation. 2.  The patient has malrotated small bowel anatomy. 3.  Enhancement of the small bowel mucosa with vascular engorgement.  No  portal venous gas or significant bowel wall thickening.  Recommend close observation for ischemia.  4.  Severe sigmoid diverticulosis without diverticulitis. 5.  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  Findings discussed Dr. Rosalia Hammers on 03/07/2011 at 12:20 p.m.  Original Report Authenticated By: Genevive Bi, M.D.   Dg Chest Port 1 View  03/07/2011  *RADIOLOGY REPORT*  Clinical Data: Nasogastric placement  PORTABLE CHEST - 1 VIEW  Comparison: None.  Findings: Nasogastric tube enters the stomach.  The lungs show abnormal interstitial markings that could be due to chronic scarring or interstitial  edema.  IMPRESSION: Nasogastric tube enters the stomach.  Abnormal interstitial pulmonary density could be scarring or edema.  Original Report Authenticated By: Thomasenia Sales, M.D.   Dg Abd 2 Views  03/09/2011  *RADIOLOGY REPORT*  Clinical Data: Small bowel obstruction.  ABDOMEN - 2 VIEW  Comparison: 03/07/2011 CT.  Findings: Nasogastric tube has been placed with the side hole at the distal esophagus level and the tip at the proximal fundus level.  This may need to be advanced.  No plain film evidence of bowel obstruction.  The possibility of free intraperitoneal air cannot be addressed on a supine view.  Prominent lung changes most notable on the left.  IMPRESSION: No plain film evidence of small bowel obstruction.  Nasogastric tube may need to be repositioned as noted above.  Original Report Authenticated By: Fuller Canada, M.D.    MEDICATIONS: Scheduled Meds:   .  furosemide  40 mg Intravenous Once  . metoprolol  5 mg Intravenous Q4H  . pantoprazole (PROTONIX) IV  40 mg Intravenous Q12H   Continuous Infusions:   . dextrose 5 % and 0.45 % NaCl with KCl 10 mEq/L 100 mL/hr at 03/09/11 1523  . heparin 950 Units/hr (03/09/11 1351)   PRN Meds:.acetaminophen, ketorolac  Antibiotics: Anti-infectives    None      Assessment/Plan: Patient Active Hospital Problem List: SBO (small bowel obstruction) (03/08/2011)   Assessment: Better.    Plan: Clear liquids started. Advance diet slowly. Appreciated CCS followup.   A-fib    Assessment: Chronic atrial fibrillation with paroxysms of rapid ventricular response.    Plan: Resume oral diltiazem, as patient now on clear liquids. We'll slowly add back beta blockers. Continue with heparin infusion. Appreciate cardiology followup.  CAD (coronary artery disease) -S/P CABG (coronary artery bypass graft)   Assessment: Stable  Plan: Will add aspirin at discharge.   HTN (hypertension) (03/07/2011)   Assessment: controlled   Plan: stable, resume cardizem. Lopressor as needed.  S/P MVR (mitral valve repair) (03/07/2011)   Assessment: stable on 2D Echo   Plan: Monitor While on anticoagulation.  Disposition: Remain inpatient.  DVT Prophylaxis: Not needed as patient on heparin infusion.  Code Status: Full code.  Maretta Bees, fifth  MD. 03/09/2011, 3:57 PM

## 2011-03-09 NOTE — Progress Notes (Signed)
Subjective: Passing some gas, no abd pain, coughing up some phlegm  Objective: Vital signs in last 24 hours: Temp:  [97.6 F (36.4 C)-99.5 F (37.5 C)] 97.6 F (36.4 C) (12/05 0500) Pulse Rate:  [95-118] 113  (12/05 0500) Resp:  [18-20] 18  (12/05 0500) BP: (100-145)/(68-84) 122/84 mmHg (12/05 0500) SpO2:  [90 %-98 %] 98 % (12/05 0500) Weight:  [66.225 kg (146 lb)] 146 lb (66.225 kg) (12/05 0500) Last BM Date: 03/07/11  Intake/Output from previous day: 12/04 0701 - 12/05 0700 In: 2717.9 [P.O.:60; I.V.:2647.9; IV Piggyback:10] Out: 600 [Emesis/NG output:600] Intake/Output this shift:    General appearance: alert GI: soft, nt, +BS  Lab Results:   [facility] 03/09/11 0540 03/08/11 0515  WBC 15.3* 12.9*  HGB 13.3 14.1  HCT 39.4 41.2  PLT 150 173   BMET  Basename 03/09/11 0540 03/08/11 0515  NA 135 138  K 4.2 4.3  CL 100 100  CO2 30 27  GLUCOSE 124* 128*  BUN 17 19  CREATININE 1.10 1.01  CALCIUM 8.5 8.7   PT/INR  Basename 03/08/11 0515 03/07/11 0940  LABPROT 17.9* 15.4*  INR 1.45 1.19   ABG No results found for this basename: PHART:2,PCO2:2,PO2:2,HCO3:2 in the last 72 hours  Studies/Results: Dg Chest 2 View  03/09/2011  *RADIOLOGY REPORT*  Clinical Data: Atrial fibrillation.  Cough. Shortness of breath.  CHEST - 2 VIEW  Comparison: 03/07/2011.  Findings: Nasogastric tube side hole at the distal esophagus level with the tip just beyond the expected gastroesophageal junction. This may need to be advanced.  Pulmonary vascular congestion superimposed on chronic lung changes suspected.  Follow-up until clearance recommended.  Since the prior examination, there has been slight improved aeration of the left base.  Calcified mildly tortuous aorta.  IMPRESSION: Slight improvement in degree of aeration of the left base.  Nasogastric tube may need to be repositioned as discussed above.  Original Report Authenticated By: Fuller Canada, M.D.   Ct Abdomen Pelvis W  Contrast  03/07/2011  *RADIOLOGY REPORT*  Clinical Data: Abdominal pain.  Coronary artery disease. Cholecystectomy.  CT ABDOMEN AND PELVIS WITH CONTRAST  Technique:  Multidetector CT imaging of the abdomen and pelvis was performed following the standard protocol during bolus administration of intravenous contrast.  Contrast: OMNIPAQUE IOHEXOL 300 MG/ML IV SOLN  Comparison: None  Findings: There is a reticular pattern at the lung bases consistent with mild interstitial lung disease.  No focal consolidation. Heart is enlarged.  No pericardial fluid.  There is no focal hepatic lesion.  There is mild intrahepatic biliary duct dilatation likely related to prior cholecystectomy. Pancreas, spleen, and adrenal glands are normal.  There is severe cortical thinning of the left kidney which may relate to prior infarction.  There is simple cyst of the left kidney.  There is cortical scarring of the right kidney additionally.  Several simple right renal cysts are present.  The stomach is moderately distended with the oral contrast.  The duodenum is not dilated.  The third portion the duodenum does not cross the spine and turns back towards the right upper quadrant (image 38).  The majority of the small bowel is in the right upper quadrant.  These findings are consistent with malrotation.  There are dilated loops of proximal small bowel up to 4 cm.  The distal ileum is collapsed at 10 mm.  There is a long segment of collapsed ileum.  There is no clear transition point and  there are multiple dilated loops of  proximal small bowel.  There is enhancement within the bowel wall and prominence of the vessels within the mesentery. No significant bowel wall thickening.  No portal venous gas.  The colon is predominately collapsed without evidence of mass lesion.  There are diverticula in the sigmoid colon which is severe.  Stool ball in the rectum.  Abdominal aorta normal caliber.  There is no evidence of portal venous gas.  No  evidence of intraperitoneal free air.  No free fluid the pelvis.  The bladder prostate gland are normal. Review of  bone windows demonstrates no aggressive osseous lesions.  IMPRESSION:  1.  Findings consistent with early mechanical small bowel obstruction.  Etiology would include adhesions or malrotation. 2.  The patient has malrotated small bowel anatomy. 3.  Enhancement of the small bowel mucosa with vascular engorgement.  No  portal venous gas or significant bowel wall thickening.  Recommend close observation for ischemia.  4.  Severe sigmoid diverticulosis without diverticulitis. 5.  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  Findings discussed Dr. Rosalia Hammers on 03/07/2011 at 12:20 p.m.  Original Report Authenticated By: Genevive Bi, M.D.   Dg Chest Port 1 View  03/07/2011  *RADIOLOGY REPORT*  Clinical Data: Nasogastric placement  PORTABLE CHEST - 1 VIEW  Comparison: None.  Findings: Nasogastric tube enters the stomach.  The lungs show abnormal interstitial markings that could be due to chronic scarring or interstitial edema.  IMPRESSION: Nasogastric tube enters the stomach.  Abnormal interstitial pulmonary density could be scarring or edema.  Original Report Authenticated By: Thomasenia Sales, M.D.   Dg Abd 2 Views  03/09/2011  *RADIOLOGY REPORT*  Clinical Data: Small bowel obstruction.  ABDOMEN - 2 VIEW  Comparison: 03/07/2011 CT.  Findings: Nasogastric tube has been placed with the side hole at the distal esophagus level and the tip at the proximal fundus level.  This may need to be advanced.  No plain film evidence of bowel obstruction.  The possibility of free intraperitoneal air cannot be addressed on a supine view.  Prominent lung changes most notable on the left.  IMPRESSION: No plain film evidence of small bowel obstruction.  Nasogastric tube may need to be repositioned as noted above.  Original Report Authenticated By: Fuller Canada, M.D.    Anti-infectives: Anti-infectives    None       Assessment/Plan: SBO - xray this AM shows no obstruction, passing flatus - will D/C NGT and start clears   LOS: 2 days    Charlene Detter E 03/09/2011

## 2011-03-10 LAB — BASIC METABOLIC PANEL
Chloride: 101 mEq/L (ref 96–112)
GFR calc Af Amer: 78 mL/min — ABNORMAL LOW (ref >90)
GFR calc non Af Amer: 67 mL/min — ABNORMAL LOW (ref >90)
Potassium: 3.5 mEq/L (ref 3.5–5.1)
Sodium: 137 mEq/L (ref 135–145)

## 2011-03-10 LAB — CBC
MCHC: 33.8 g/dL (ref 30.0–36.0)
Platelets: 157 10*3/uL (ref 150–400)
RDW: 12.5 % (ref 11.5–15.5)
WBC: 9.8 10*3/uL (ref 4.0–10.5)

## 2011-03-10 LAB — HEPARIN LEVEL (UNFRACTIONATED): Heparin Unfractionated: 0.4 IU/mL (ref 0.30–0.70)

## 2011-03-10 MED ORDER — METOPROLOL TARTRATE 50 MG PO TABS
50.0000 mg | ORAL_TABLET | Freq: Two times a day (BID) | ORAL | Status: DC
  Administered 2011-03-10 – 2011-03-12 (×5): 50 mg via ORAL
  Filled 2011-03-10 (×6): qty 1

## 2011-03-10 NOTE — Progress Notes (Signed)
Physical Therapy Evaluation Patient Details Name: Jordan Blackwell MRN: [id number] DOB: [birth year] Today's Date: 03/10/2011  Problem List:  Patient Active Problem List  Diagnoses  . A-fib  . CAD (coronary artery disease)  . S/P CABG (coronary artery bypass graft)  . HTN (hypertension)  . S/P MVR (mitral valve repair)  . SBO (small bowel obstruction)    Past Medical History:  Past Medical History  Diagnosis Date  . Coronary artery disease   . Stroke   . Atrial fibrillation   . Atrial thrombus   . Hypertension   . B12 deficiency   . Folic acid deficiency    Past Surgical History:  Past Surgical History  Procedure Date  . Inguinal hernia repair   . Coronary artery bypass graft     1993  . Mitral valve anuloplasty   . Cholecystectomy   . Ankle fracture surgery   . Appendectomy   . Abdominal adhesion surgery     PT Assessment/Plan/Recommendation PT Assessment Clinical Impression Statement: Pt presents to [facility] with partial SBO. Pt with generalized weakness associated with immobilty during his hospital stay and minimal gait instability which is likely close to baseline following a left foot fracture repair 4 years ago (reports he uses a cane at baseline). Pt  will benefit skilled therapy in the acute setting to maximize mobility, activity tolerance and indendence so as to increase safety at home.  PT Recommendation/Assessment: Patient will need skilled PT in the acute care venue PT Problem List: Decreased range of motion;Decreased activity tolerance;Decreased balance Barriers to Discharge: Decreased caregiver support PT Therapy Diagnosis : Abnormality of gait PT Plan PT Frequency: Min 3X/week PT Treatment/Interventions: Gait training;Stair training;Functional mobility training;Therapeutic activities;Therapeutic exercise;DME instruction;Balance training;Neuromuscular re-education;Patient/family education PT Recommendation Follow Up Recommendations: Outpatient PT (vs no follow  up depending pt's progress) Equipment Recommended: None recommended by PT PT Goals  Acute Rehab PT Goals PT Goal Formulation: With patient Time For Goal Achievement: 7 days Additional Goals Additional Goal #1: Pt will demonstrate decreased risk of falls with DGI score greater than or equal to 23/24.  PT Goal: Additional Goal #1 - Progress: Progressing toward goal  PT Evaluation Precautions/Restrictions  Restrictions Weight Bearing Restrictions: No Prior Functioning  Home Living Lives With: Alone Receives Help From: Family (daughter helps when needed) Type of Home: House Home Layout: Two level;Able to live on main level with bedroom/bathroom Alternate Level Stairs-Number of Steps: doesn't go upstaits Home Access: Stairs to enter [employer] of Steps: 2 (holds onto the door) Bathroom Shower/Tub: Health visitor: Standard Home Adaptive Equipment: Shower chair without back;Straight cane Prior Function Level of Independence: Independent with basic ADLs;Independent with homemaking with ambulation;Requires assistive device for independence;Independent with transfers Driving: Yes Comments: uses a cane often because he has a "bad foot", uses cane when he is walking a lot Cognition Cognition Arousal/Alertness: Awake/alert Overall Cognitive Status: Appears within functional limits for tasks assessed Cognition - Other Comments: Very good safety awareness; pt verbalizing safe technique for stairs and need for use of rails, use of AD when tired Sensation/Coordination Sensation Light Touch: Appears Intact Coordination Gross Motor Movements are Fluid and Coordinated: Yes Fine Motor Movements are Fluid and Coordinated: Yes Extremity Assessment RUE Assessment RUE Assessment: Within Functional Limits LUE Assessment LUE Assessment: Within Functional Limits RLE Assessment RLE Assessment: Within Functional Limits LLE Strength LLE Overall Strength Comments: 4 years  ago pt fractured his foot in multiple places and had corrective surgery so foot is relatively fixed in a supinated position  but generally his LLE is [facility] strength Mobility (including Balance) Bed Mobility Bed Mobility: Yes Supine to Sit: 6: Modified independent (Device/Increase time);HOB elevated (Comment degrees) Transfers Transfers: Yes Sit to Stand: 5: Supervision;From bed Sit to Stand Details (indicate cue type and reason): pt using hands to assist from bed Stand to Sit: 6: Modified independent (Device/Increase time);To chair/3-in-1 Ambulation/Gait Ambulation/Gait: Yes Ambulation/Gait Assistance Details (indicate cue type and reason): pt amb approx 450 ft with mingaurdA and no AD (baseline pt reports using SPC); pt amb with antalgic gait on Left from chronic foot fx/repair, amb with slightly supinated foot on left; minimal lateral staggering during gait especially with head turns but pt able to correct independently Ambulation Distance (Feet): 450 Feet Assistive device: None Gait Pattern: Antalgic Stairs: Yes Stairs Assistance: 5: Supervision Stairs Assistance Details (indicate cue type and reason): step by step pattern, attempted steps without rail but pt with good safety awareness reporting he knows he can't do that as pt using rails to steady himself Stair Management Technique: One rail Right Number of Stairs: 2   Posture/Postural Control Posture/Postural Control: No significant limitations Dynamic Gait Index Level Surface: Normal Change in Gait Speed: Mild Impairment Gait with Horizontal Head Turns: Mild Impairment Gait with Vertical Head Turns: Normal Gait and Pivot Turn: Mild Impairment Step Over Obstacle: Normal Step Around Obstacles: Mild Impairment Steps: Mild Impairment Total Score: 19  Exercise    End of Session PT - End of Session Equipment Utilized During Treatment: Gait belt Activity Tolerance: Patient tolerated treatment well Patient left: in chair Nurse  Communication: Mobility status for transfers;Mobility status for ambulation General Behavior During Session: [facility] for tasks performed Cognition: [facility] for tasks performed  [facility] HELEN 03/10/2011, 10:10 AM

## 2011-03-10 NOTE — Progress Notes (Signed)
Subjective:  Denies chest pain or dyspnea today.  Passing flatus. Weight down 4 lb today after lasix yesterday.  Objective:  Vital Signs in the last 24 hours: Temp:  [98.2 F (36.8 C)-98.6 F (37 C)] 98.6 F (37 C) (12/06 0423) Pulse Rate:  [81-109] 81  (12/06 0423) Resp:  [18] 18  (12/06 0423) BP: (97-121)/(65-74) 97/65 mmHg (12/06 0423) SpO2:  [94 %-98 %] 94 % (12/06 0423) Weight:  [142 lb 14.4 oz (64.819 kg)] 142 lb 14.4 oz (64.819 kg) (12/06 0423)  Intake/Output from previous day: 12/05 0701 - 12/06 0700 In: 3835.1 [P.O.:1200; I.V.:2635.1] Out: 4225 [Urine:3700; Emesis/NG output:525] Intake/Output from this shift:       . diltiazem  180 mg Oral Daily  . furosemide  40 mg Intravenous Once  . metoprolol  5 mg Intravenous Q4H  . pantoprazole (PROTONIX) IV  40 mg Intravenous Q12H  . DISCONTD: diltiazem  180 mg Oral Daily      . dextrose 5 % and 0.45 % NaCl with KCl 10 mEq/L 100 mL/hr at 03/10/11 0208  . heparin 950 Units/hr (03/10/11 1610)    Physical Exam: The patient appears to be in no distress.  Head and neck exam reveals that the pupils are equal and reactive.  The extraocular movements are full.  There is no scleral icterus.  Mouth and pharynx are benign.  No lymphadenopathy.  No carotid bruits.  The jugular venous pressure is normal.  Thyroid is not enlarged or tender.  Chest reveals few crackling rales but improved.  Heart reveals no abnormal lift or heave.  First and second heart sounds are normal.  There is no murmur gallop rub or click.  Good valve clicks.   The abdomen is soft and nontender.  Bowel sounds are normoactive.  There is no hepatosplenomegaly or mass.  There are no abdominal bruits.  Extremities reveal no phlebitis or edema.  Pedal pulses are good.  There is no cyanosis or clubbing.  Neurologic exam is normal strength and no lateralizing weakness.  No sensory deficits.  Integument reveals no rash  Lab Results:  Basename 03/10/11 0710  03/09/11 1758  WBC 9.8 13.5*  HGB 12.4* 13.4  PLT 157 160    Basename 03/09/11 1120 03/09/11 0540  NA 134* 135  K 4.1 4.2  CL 102 100  CO2 25 30  GLUCOSE 128* 124*  BUN 15 17  CREATININE 0.95 1.10    Basename 03/08/11 1857 03/08/11 0749  TROPONINI <0.30 <0.30   Hepatic Function Panel  Basename 03/08/11 0515  PROT 6.8  ALBUMIN 3.2*  AST 19  ALT 11  ALKPHOS 59  BILITOT 0.7  BILIDIR --  IBILI --   No results found for this basename: CHOL in the last 72 hours No results found for this basename: PROTIME in the last 72 hours  Imaging: Imaging results have been reviewed.  Cardiac Studies: Telemetry shows atrial fib with controlled VR. Assessment/Plan:  Patient Active Problem List  Diagnoses  . A-fib  . CAD (coronary artery disease)  . S/P CABG (coronary artery bypass graft)  . HTN (hypertension)  . S/P MVR (mitral valve repair)  . SBO (small bowel obstruction)  Plan: Convert lopressor back to po Prior to discharge he will need to transition back to Pradaxa.    LOS: 3 days    Cassell Clement 03/10/2011, 8:01 AM

## 2011-03-10 NOTE — Progress Notes (Signed)
  Subjective: Passing gas, no abd pain  Objective: Vital signs in last 24 hours: Temp:  [98.2 F (36.8 C)-98.6 F (37 C)] 98.6 F (37 C) (12/06 0423) Pulse Rate:  [81-109] 81  (12/06 0423) Resp:  [18] 18  (12/06 0423) BP: (97-121)/(65-74) 97/65 mmHg (12/06 0423) SpO2:  [94 %-98 %] 94 % (12/06 0423) Weight:  [64.819 kg (142 lb 14.4 oz)] 142 lb 14.4 oz (64.819 kg) (12/06 0423) Last BM Date: 03/07/11  Intake/Output from previous day: 12/05 0701 - 12/06 0700 In: 3835.1 [P.O.:1200; I.V.:2635.1] Out: 4225 [Urine:3700; Emesis/NG output:525] Intake/Output this shift:    General appearance: alert Resp: clear to auscultation bilaterally GI: soft, NT, ND, +bs  Lab Results:   [facility] 03/10/11 0710 03/09/11 1758  WBC 9.8 13.5*  HGB 12.4* 13.4  HCT 36.7* 39.3  PLT 157 160   BMET  Basename 03/10/11 0710 03/09/11 1120  NA 137 134*  K 3.5 4.1  CL 101 102  CO2 32 25  GLUCOSE 119* 128*  BUN 11 15  CREATININE 1.03 0.95  CALCIUM 8.5 8.5   PT/INR  Basename 03/08/11 0515 03/07/11 0940  LABPROT 17.9* 15.4*  INR 1.45 1.19   ABG No results found for this basename: PHART:2,PCO2:2,PO2:2,HCO3:2 in the last 72 hours  Studies/Results: Dg Chest 2 View  03/09/2011  *RADIOLOGY REPORT*  Clinical Data: Atrial fibrillation.  Cough. Shortness of breath.  CHEST - 2 VIEW  Comparison: 03/07/2011.  Findings: Nasogastric tube side hole at the distal esophagus level with the tip just beyond the expected gastroesophageal junction. This may need to be advanced.  Pulmonary vascular congestion superimposed on chronic lung changes suspected.  Follow-up until clearance recommended.  Since the prior examination, there has been slight improved aeration of the left base.  Calcified mildly tortuous aorta.  IMPRESSION: Slight improvement in degree of aeration of the left base.  Nasogastric tube may need to be repositioned as discussed above.  Original Report Authenticated By: Fuller Canada, M.D.   Dg Abd 2  Views  03/09/2011  *RADIOLOGY REPORT*  Clinical Data: Small bowel obstruction.  ABDOMEN - 2 VIEW  Comparison: 03/07/2011 CT.  Findings: Nasogastric tube has been placed with the side hole at the distal esophagus level and the tip at the proximal fundus level.  This may need to be advanced.  No plain film evidence of bowel obstruction.  The possibility of free intraperitoneal air cannot be addressed on a supine view.  Prominent lung changes most notable on the left.  IMPRESSION: No plain film evidence of small bowel obstruction.  Nasogastric tube may need to be repositioned as noted above.  Original Report Authenticated By: Fuller Canada, M.D.    Anti-infectives: Anti-infectives    None      Assessment/Plan: SBO resolving - try full liquids Cardiology input noted   LOS: 3 days    Jordan Blackwell 03/10/2011

## 2011-03-10 NOTE — Progress Notes (Signed)
ANTICOAGULATION CONSULT NOTE - Follow Up Consult  Pharmacy Consult for Heparin Indication: atrial fibrillation  No Known Allergies  Patient Measurements: Height: 5\' 7"  (170.2 cm) Weight: 142 lb 14.4 oz (64.819 kg) (scale a) IBW/kg (Calculated) : 66.1   Vital Signs: Temp: 98.6 F (37 C) (12/06 0423) Temp src: Oral (12/06 0423) BP: 97/65 mmHg (12/06 0423) Pulse Rate: 81  (12/06 0423)  Labs:  Basename 03/10/11 0710 03/09/11 1758 03/09/11 1120 03/09/11 0540 03/08/11 1857 03/08/11 0749 03/08/11 0515 03/08/11 0025  HGB 12.4* 13.4 -- -- -- -- -- --  HCT 36.7* 39.3 -- 39.4 -- -- -- --  PLT 157 160 -- 150 -- -- -- --  APTT -- -- -- -- -- -- -- --  LABPROT -- -- -- -- -- -- 17.9* --  INR -- -- -- -- -- -- 1.45 --  HEPARINUNFRC 0.40 0.61 -- 0.71* -- -- -- --  CREATININE 1.03 -- 0.95 1.10 -- -- -- --  CKTOTAL -- -- -- -- 38 32 -- 37  CKMB -- -- -- -- 2.4 2.1 -- 2.2  TROPONINI -- -- -- -- <0.30 <0.30 -- <0.30   Estimated Creatinine Clearance: 53.3 ml/min (by C-G formula based on Cr of 1.03).   Medications:  Infusions:    . dextrose 5 % and 0.45 % NaCl with KCl 10 mEq/L 100 mL/hr at 03/10/11 0208  . heparin 950 Units/hr (03/10/11 0208)    Assessment: Atrial Fibrillation: Remains therapeutic on Heparin for anticoagulation.  Pradaxa remains on hold due to SBO.  Goal of Therapy:  Heparin level 0.3-0.7 units/ml   Plan:  Continue Heparin at 950 units/hr Check AM Heparin level and CBC  Estella Husk, Pharm.D., BCPS Clinical Pharmacist  Pager [phone] 03/10/2011, 11:25 AM

## 2011-03-10 NOTE — Progress Notes (Signed)
PATIENT DETAILS Name: Jordan Blackwell Age: 77 y.o. Sex: male Date of Birth: Dec 31, 1929 Admit Date: 03/07/2011 ZOX:WRUEA, [name], MD  Subjective: Better. Tolerating clear liquids.Passing Flatus, but no BM yet.  Objective: Vital signs in last 24 hours: Filed Vitals:   03/09/11 1300 03/09/11 1827 03/09/11 1900 03/10/11 0423  BP: 121/74 120/70 117/73 97/65  Pulse: 83  109 81  Temp: 98.4 F (36.9 C)  98.2 F (36.8 C) 98.6 F (37 C)  TempSrc:   Oral Oral  Resp: 18  18 18   Height:      Weight:    64.819 kg (142 lb 14.4 oz)  SpO2: 98%  94% 94%    Weight change: -1.406 kg (-3 lb 1.6 oz)  Body mass index is 22.38 kg/(m^2).  Intake/Output from previous day:  Intake/Output Summary (Last 24 hours) at 03/10/11 1159 Last data filed at 03/10/11 0900  Gross per 24 hour  Intake 3835.13 ml  Output   4050 ml  Net -214.87 ml    PHYSICAL EXAM: Gen Exam: Awake and alert with clear speech.   Neck: Supple, No JVD.   Chest: B/L Clear.   CVS: S1 S2 irregular, no murmurs.  Abdomen: soft, BS +, non tender, non distended.  Extremities: no edema, lower extremities warm to touch. Neurologic: Non Focal.   Skin: No Rash.   Wounds: N/A.    CONSULTS:  cardiology and general surgery  LAB RESULTS: CBC  Lab 03/10/11 0710 03/09/11 1758 03/09/11 0540 03/08/11 0515 03/07/11 0940  WBC 9.8 13.5* 15.3* 12.9* 12.5*  HGB 12.4* 13.4 13.3 14.1 15.4  HCT 36.7* 39.3 39.4 41.2 44.2  PLT 157 160 150 173 199  MCV 92.0 92.0 92.7 92.0 89.1  MCH 31.1 31.4 31.3 31.5 31.0  MCHC 33.8 34.1 33.8 34.2 34.8  RDW 12.5 12.6 12.8 12.9 12.4  LYMPHSABS -- -- -- -- 1.4  MONOABS -- -- -- -- 1.3*  EOSABS -- -- -- -- 0.1  BASOSABS -- -- -- -- 0.0  BANDABS -- -- -- -- --    Chemistries   Lab 03/10/11 0710 03/09/11 1120 03/09/11 0540 03/08/11 0515 03/07/11 0940  NA 137 134* 135 138 140  K 3.5 4.1 4.2 4.3 4.5  CL 101 102 100 100 101  CO2 32 25 30 27 30   GLUCOSE 119* 128* 124* 128* 119*  BUN 11 15 17 19   25*  CREATININE 1.03 0.95 1.10 1.01 1.00  CALCIUM 8.5 8.5 8.5 8.7 10.6*  MG -- -- -- -- --    GFR Estimated Creatinine Clearance: 53.3 ml/min (by C-G formula based on Cr of 1.03).  Coagulation profile  Lab 03/08/11 0515 03/07/11 0940  INR 1.45 1.19  PROTIME -- --    Cardiac Enzymes  Lab 03/08/11 1857 03/08/11 0749 03/08/11 0025  CKMB 2.4 2.1 2.2  TROPONINI <0.30 <0.30 <0.30  MYOGLOBIN -- -- --    No results found for this basename: POCBNP:3 in the last 168 hours No results found for this basename: DDIMER:2 in the last 72 hours No results found for this basename: HGBA1C:2 in the last 72 hours No results found for this basename: CHOL:2,HDL:2,LDLCALC:2,TRIG:2,CHOLHDL:2,LDLDIRECT:2 in the last 72 hours No results found for this basename: TSH,T4TOTAL,FREET3,T3FREE,THYROIDAB in the last 72 hours No results found for this basename: VITAMINB12:2,FOLATE:2,FERRITIN:2,TIBC:2,IRON:2,RETICCTPCT:2 in the last 72 hours No results found for this basename: LIPASE:2,AMYLASE:2 in the last 72 hours  Urine Studies No results found for this basename: UACOL:2,UAPR:2,USPG:2,UPH:2,UTP:2,UGL:2,UKET:2,UBIL:2,UHGB:2,UNIT:2,UROB:2,ULEU:2,UEPI:2,UWBC:2,URBC:2,UBAC:2,CAST:2,CRYS:2,UCOM:2,BILUA:2 in the last 72 hours  MICROBIOLOGY: No results found  for this or any previous visit (from the past 240 hour(s)).  RADIOLOGY STUDIES/RESULTS: Dg Chest 2 View  03/09/2011  *RADIOLOGY REPORT*  Clinical Data: Atrial fibrillation.  Cough. Shortness of breath.  CHEST - 2 VIEW  Comparison: 03/07/2011.  Findings: Nasogastric tube side hole at the distal esophagus level with the tip just beyond the expected gastroesophageal junction. This may need to be advanced.  Pulmonary vascular congestion superimposed on chronic lung changes suspected.  Follow-up until clearance recommended.  Since the prior examination, there has been slight improved aeration of the left base.  Calcified mildly tortuous aorta.  IMPRESSION: Slight  improvement in degree of aeration of the left base.  Nasogastric tube may need to be repositioned as discussed above.  Original Report Authenticated By: Fuller Canada, M.D.   Ct Abdomen Pelvis W Contrast  03/07/2011  *RADIOLOGY REPORT*  Clinical Data: Abdominal pain.  Coronary artery disease. Cholecystectomy.  CT ABDOMEN AND PELVIS WITH CONTRAST  Technique:  Multidetector CT imaging of the abdomen and pelvis was performed following the standard protocol during bolus administration of intravenous contrast.  Contrast: OMNIPAQUE IOHEXOL 300 MG/ML IV SOLN  Comparison: None  Findings: There is a reticular pattern at the lung bases consistent with mild interstitial lung disease.  No focal consolidation. Heart is enlarged.  No pericardial fluid.  There is no focal hepatic lesion.  There is mild intrahepatic biliary duct dilatation likely related to prior cholecystectomy. Pancreas, spleen, and adrenal glands are normal.  There is severe cortical thinning of the left kidney which may relate to prior infarction.  There is simple cyst of the left kidney.  There is cortical scarring of the right kidney additionally.  Several simple right renal cysts are present.  The stomach is moderately distended with the oral contrast.  The duodenum is not dilated.  The third portion the duodenum does not cross the spine and turns back towards the right upper quadrant (image 38).  The majority of the small bowel is in the right upper quadrant.  These findings are consistent with malrotation.  There are dilated loops of proximal small bowel up to 4 cm.  The distal ileum is collapsed at 10 mm.  There is a long segment of collapsed ileum.  There is no clear transition point and  there are multiple dilated loops of proximal small bowel.  There is enhancement within the bowel wall and prominence of the vessels within the mesentery. No significant bowel wall thickening.  No portal venous gas.  The colon is predominately collapsed  without evidence of mass lesion.  There are diverticula in the sigmoid colon which is severe.  Stool ball in the rectum.  Abdominal aorta normal caliber.  There is no evidence of portal venous gas.  No evidence of intraperitoneal free air.  No free fluid the pelvis.  The bladder prostate gland are normal. Review of  bone windows demonstrates no aggressive osseous lesions.  IMPRESSION:  1.  Findings consistent with early mechanical small bowel obstruction.  Etiology would include adhesions or malrotation. 2.  The patient has malrotated small bowel anatomy. 3.  Enhancement of the small bowel mucosa with vascular engorgement.  No  portal venous gas or significant bowel wall thickening.  Recommend close observation for ischemia.  4.  Severe sigmoid diverticulosis without diverticulitis. 5.  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  Findings discussed Dr. Rosalia Hammers on 03/07/2011 at 12:20 p.m.  Original Report Authenticated By: Genevive Bi, M.D.   Dg Chest [facility]  1 View  03/07/2011  *RADIOLOGY REPORT*  Clinical Data: Nasogastric placement  PORTABLE CHEST - 1 VIEW  Comparison: None.  Findings: Nasogastric tube enters the stomach.  The lungs show abnormal interstitial markings that could be due to chronic scarring or interstitial edema.  IMPRESSION: Nasogastric tube enters the stomach.  Abnormal interstitial pulmonary density could be scarring or edema.  Original Report Authenticated By: Thomasenia Sales, M.D.   Dg Abd 2 Views  03/09/2011  *RADIOLOGY REPORT*  Clinical Data: Small bowel obstruction.  ABDOMEN - 2 VIEW  Comparison: 03/07/2011 CT.  Findings: Nasogastric tube has been placed with the side hole at the distal esophagus level and the tip at the proximal fundus level.  This may need to be advanced.  No plain film evidence of bowel obstruction.  The possibility of free intraperitoneal air cannot be addressed on a supine view.  Prominent lung changes most notable on the left.  IMPRESSION: No plain film  evidence of small bowel obstruction.  Nasogastric tube may need to be repositioned as noted above.  Original Report Authenticated By: Fuller Canada, M.D.    MEDICATIONS: Scheduled Meds:    . diltiazem  180 mg Oral Daily  . furosemide  40 mg Intravenous Once  . metoprolol tartrate  50 mg Oral BID  . pantoprazole (PROTONIX) IV  40 mg Intravenous Q12H  . DISCONTD: diltiazem  180 mg Oral Daily  . DISCONTD: metoprolol  5 mg Intravenous Q4H   Continuous Infusions:    . dextrose 5 % and 0.45 % NaCl with KCl 10 mEq/L 100 mL/hr at 03/10/11 0208  . heparin 950 Units/hr (03/10/11 0208)   PRN Meds:.sodium chloride, acetaminophen, ketorolac  Antibiotics: Anti-infectives    None      Assessment/Plan: Patient Active Hospital Problem List: SBO (small bowel obstruction) (03/08/2011)   Assessment: Better.    Plan:Now on full liquids. Advance diet slowly. Appreciated CCS followup.   A-fib    Assessment: Chronic atrial fibrillation with paroxysms of rapid ventricular response.    Plan:on oral diltiazem, resume back beta blockers. Continue with heparin infusion-will transition to Pradaxa on discharge. Appreciate cardiology followup.  CAD (coronary artery disease) -S/P CABG (coronary artery bypass graft)   Assessment: Stable  Plan: Will add aspirin at discharge.   HTN (hypertension) (03/07/2011)   Assessment: controlled   Plan: stable, on cardizem. Lopressor started orally as well.  S/P MVR (mitral valve repair) (03/07/2011)   Assessment: stable on 2D Echo   Plan: Monitor While on anticoagulation.  Disposition: Remain inpatient.  DVT Prophylaxis: Not needed as patient on heparin infusion.  Code Status: Full code.  Maretta Bees, fifth  MD. 03/10/2011, 11:59 AM

## 2011-03-11 LAB — BASIC METABOLIC PANEL
CO2: 30 mEq/L (ref 19–32)
Calcium: 9.2 mg/dL (ref 8.4–10.5)
GFR calc non Af Amer: 76 mL/min — ABNORMAL LOW (ref >90)
Glucose, Bld: 103 mg/dL — ABNORMAL HIGH (ref 70–99)
Potassium: 3.6 mEq/L (ref 3.5–5.1)
Sodium: 139 mEq/L (ref 135–145)

## 2011-03-11 LAB — CBC
Hemoglobin: 12.5 g/dL — ABNORMAL LOW (ref 13.0–17.0)
MCH: 31.7 pg (ref 26.0–34.0)
Platelets: 177 10*3/uL (ref 150–400)
RBC: 3.94 MIL/uL — ABNORMAL LOW (ref 4.22–5.81)

## 2011-03-11 LAB — HEPARIN LEVEL (UNFRACTIONATED): Heparin Unfractionated: 0.28 IU/mL — ABNORMAL LOW (ref 0.30–0.70)

## 2011-03-11 MED ORDER — FUROSEMIDE 40 MG PO TABS
40.0000 mg | ORAL_TABLET | Freq: Once | ORAL | Status: AC
  Administered 2011-03-11: 40 mg via ORAL
  Filled 2011-03-11: qty 1

## 2011-03-11 NOTE — Progress Notes (Signed)
PATIENT DETAILS Name: Jordan Blackwell Age: 77 y.o. Sex: male Date of Birth: [birth year] Admit Date: 03/07/2011 ZOX:WRUEA, Jordan John, MD  Subjective: Better. Had BM last night. Tolerating full liquids.  Objective: Vital signs in last 24 hours: Filed Vitals:   03/10/11 2049 03/11/11 0357 03/11/11 1100 03/11/11 1439  BP: 123/70 132/78 122/80 115/73  Pulse: 84 79 79 73  Temp: 98.1 F (36.7 C) 97.8 F (36.6 C)  97.5 F (36.4 C)  TempSrc:      Resp: 18 18 16 16   Height:      Weight:  65 kg (143 lb 4.8 oz)    SpO2: 95% 94% 95% 96%    Weight change: 0.181 kg (6.4 oz)  Body mass index is 22.44 kg/(m^2).  Intake/Output from previous day:  Intake/Output Summary (Last 24 hours) at 03/11/11 1551 Last data filed at 03/11/11 1441  Gross per 24 hour  Intake 2584.38 ml  Output   2526 ml  Net  58.38 ml    PHYSICAL EXAM: Gen Exam: Awake and alert with clear speech.   Neck: Supple, No JVD.   Chest: B/L Clear.   CVS: S1 S2 irregular, no murmurs.  Abdomen: soft, BS +, non tender, non distended. No rigidity or rebound. Extremities: no edema, lower extremities warm to touch. Neurologic: Non Focal.   Skin: No Rash.   Wounds: N/A.    CONSULTS:  cardiology and general surgery  LAB RESULTS: CBC  Lab 03/11/11 0640 03/10/11 0710 03/09/11 1758 03/09/11 0540 03/08/11 0515 03/07/11 0940  WBC 9.0 9.8 13.5* 15.3* 12.9* --  HGB 12.5* 12.4* 13.4 13.3 14.1 --  HCT 36.3* 36.7* 39.3 39.4 41.2 --  PLT 177 157 160 150 173 --  MCV 92.1 92.0 92.0 92.7 92.0 --  MCH 31.7 31.1 31.4 31.3 31.5 --  MCHC 34.4 33.8 34.1 33.8 34.2 --  RDW 12.5 12.5 12.6 12.8 12.9 --  LYMPHSABS -- -- -- -- -- 1.4  MONOABS -- -- -- -- -- 1.3*  EOSABS -- -- -- -- -- 0.1  BASOSABS -- -- -- -- -- 0.0  BANDABS -- -- -- -- -- --    Chemistries   Lab 03/11/11 0640 03/10/11 0710 03/09/11 1120 03/09/11 0540 03/08/11 0515  NA 139 137 134* 135 138  K 3.6 3.5 4.1 4.2 4.3  CL 101 101 102 100 100  CO2 30 32 25 30 27     GLUCOSE 103* 119* 128* 124* 128*  BUN 8 11 15 17 19   CREATININE 0.99 1.03 0.95 1.10 1.01  CALCIUM 9.2 8.5 8.5 8.5 8.7  MG -- -- -- -- --    GFR Estimated Creatinine Clearance: 55.6 ml/min (by C-G formula based on Cr of 0.99).  Coagulation profile  Lab 03/08/11 0515 03/07/11 0940  INR 1.45 1.19  PROTIME -- --    Cardiac Enzymes  Lab 03/08/11 1857 03/08/11 0749 03/08/11 0025  CKMB 2.4 2.1 2.2  TROPONINI <0.30 <0.30 <0.30  MYOGLOBIN -- -- --    No results found for this basename: POCBNP:3 in the last 168 hours No results found for this basename: DDIMER:2 in the last 72 hours No results found for this basename: HGBA1C:2 in the last 72 hours No results found for this basename: CHOL:2,HDL:2,LDLCALC:2,TRIG:2,CHOLHDL:2,LDLDIRECT:2 in the last 72 hours No results found for this basename: TSH,T4TOTAL,FREET3,T3FREE,THYROIDAB in the last 72 hours No results found for this basename: VITAMINB12:2,FOLATE:2,FERRITIN:2,TIBC:2,IRON:2,RETICCTPCT:2 in the last 72 hours No results found for this basename: LIPASE:2,AMYLASE:2 in the last 72 hours  Urine Studies  No results found for this basename: UACOL:2,UAPR:2,USPG:2,UPH:2,UTP:2,UGL:2,UKET:2,UBIL:2,UHGB:2,UNIT:2,UROB:2,ULEU:2,UEPI:2,UWBC:2,URBC:2,UBAC:2,CAST:2,CRYS:2,UCOM:2,BILUA:2 in the last 72 hours  MICROBIOLOGY: No results found for this or any previous visit (from the past 240 hour(s)).  RADIOLOGY STUDIES/RESULTS: Dg Chest 2 View  03/09/2011  *RADIOLOGY REPORT*  Clinical Data: Atrial fibrillation.  Cough. Shortness of breath.  CHEST - 2 VIEW  Comparison: 03/07/2011.  Findings: Nasogastric tube side hole at the distal esophagus level with the tip just beyond the expected gastroesophageal junction. This may need to be advanced.  Pulmonary vascular congestion superimposed on chronic lung changes suspected.  Follow-up until clearance recommended.  Since the prior examination, there has been slight improved aeration of the left base.  Calcified  mildly tortuous aorta.  IMPRESSION: Slight improvement in degree of aeration of the left base.  Nasogastric tube may need to be repositioned as discussed above.  Original Report Authenticated By: Fuller Canada, M.D.   Ct Abdomen Pelvis W Contrast  03/07/2011  *RADIOLOGY REPORT*  Clinical Data: Abdominal pain.  Coronary artery disease. Cholecystectomy.  CT ABDOMEN AND PELVIS WITH CONTRAST  Technique:  Multidetector CT imaging of the abdomen and pelvis was performed following the standard protocol during bolus administration of intravenous contrast.  Contrast: OMNIPAQUE IOHEXOL 300 MG/ML IV SOLN  Comparison: None  Findings: There is a reticular pattern at the lung bases consistent with mild interstitial lung disease.  No focal consolidation. Heart is enlarged.  No pericardial fluid.  There is no focal hepatic lesion.  There is mild intrahepatic biliary duct dilatation likely related to prior cholecystectomy. Pancreas, spleen, and adrenal glands are normal.  There is severe cortical thinning of the left kidney which may relate to prior infarction.  There is simple cyst of the left kidney.  There is cortical scarring of the right kidney additionally.  Several simple right renal cysts are present.  The stomach is moderately distended with the oral contrast.  The duodenum is not dilated.  The third portion the duodenum does not cross the spine and turns back towards the right upper quadrant (image 38).  The majority of the small bowel is in the right upper quadrant.  These findings are consistent with malrotation.  There are dilated loops of proximal small bowel up to 4 cm.  The distal ileum is collapsed at 10 mm.  There is a long segment of collapsed ileum.  There is no clear transition point and  there are multiple dilated loops of proximal small bowel.  There is enhancement within the bowel wall and prominence of the vessels within the mesentery. No significant bowel wall thickening.  No portal venous gas.   The colon is predominately collapsed without evidence of mass lesion.  There are diverticula in the sigmoid colon which is severe.  Stool ball in the rectum.  Abdominal aorta normal caliber.  There is no evidence of portal venous gas.  No evidence of intraperitoneal free air.  No free fluid the pelvis.  The bladder prostate gland are normal. Review of  bone windows demonstrates no aggressive osseous lesions.  IMPRESSION:  1.  Findings consistent with early mechanical small bowel obstruction.  Etiology would include adhesions or malrotation. 2.  The patient has malrotated small bowel anatomy. 3.  Enhancement of the small bowel mucosa with vascular engorgement.  No  portal venous gas or significant bowel wall thickening.  Recommend close observation for ischemia.  4.  Severe sigmoid diverticulosis without diverticulitis. 5.  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  Findings discussed Dr. Rosalia Hammers on  03/07/2011 at 12:20 p.m.  Original Report Authenticated By: Genevive Bi, M.D.   Dg Chest Port 1 View  03/07/2011  *RADIOLOGY REPORT*  Clinical Data: Nasogastric placement  PORTABLE CHEST - 1 VIEW  Comparison: None.  Findings: Nasogastric tube enters the stomach.  The lungs show abnormal interstitial markings that could be due to chronic scarring or interstitial edema.  IMPRESSION: Nasogastric tube enters the stomach.  Abnormal interstitial pulmonary density could be scarring or edema.  Original Report Authenticated By: Thomasenia Sales, M.D.   Dg Abd 2 Views  03/09/2011  *RADIOLOGY REPORT*  Clinical Data: Small bowel obstruction.  ABDOMEN - 2 VIEW  Comparison: 03/07/2011 CT.  Findings: Nasogastric tube has been placed with the side hole at the distal esophagus level and the tip at the proximal fundus level.  This may need to be advanced.  No plain film evidence of bowel obstruction.  The possibility of free intraperitoneal air cannot be addressed on a supine view.  Prominent lung changes most notable on  the left.  IMPRESSION: No plain film evidence of small bowel obstruction.  Nasogastric tube may need to be repositioned as noted above.  Original Report Authenticated By: Fuller Canada, M.D.    MEDICATIONS: Scheduled Meds:    . diltiazem  180 mg Oral Daily  . furosemide  40 mg Oral Once  . metoprolol tartrate  50 mg Oral BID  . pantoprazole (PROTONIX) IV  40 mg Intravenous Q12H   Continuous Infusions:    . heparin 950 Units/hr (03/11/11 0653)  . DISCONTD: dextrose 5 % and 0.45 % NaCl with KCl 10 mEq/L 20 mL/hr at 03/10/11 1302   PRN Meds:.sodium chloride, acetaminophen, ketorolac  Antibiotics: Anti-infectives    None      Assessment/Plan: Patient Active Hospital Problem List: SBO (small bowel obstruction) (03/08/2011)   Assessment: Better.    Plan: Tolerating full liquids. Advance low-residue diet. Appreciated CCS followup. If tolerates low-residue diet likely will be discharged tomorrow morning.  A-fib    Assessment: Chronic atrial fibrillation with paroxysms of rapid ventricular response.    Plan:on oral diltiazem, resume back beta blockers. Continue with heparin infusion-will transition to Pradaxa on discharge. Appreciate cardiology followup.  CAD (coronary artery disease) -S/P CABG (coronary artery bypass graft)   Assessment: Stable  Plan: Will add aspirin at discharge.   HTN (hypertension) (03/07/2011)   Assessment: controlled   Plan: stable, on cardizem. Lopressor started orally as well.  S/P MVR (mitral valve repair) (03/07/2011)   Assessment: stable on 2D Echo   Plan: Monitor While on anticoagulation.  Disposition: Remain inpatient.-Likely discharged tomorrow.  DVT Prophylaxis: Not needed as patient on heparin infusion.  Code Status: Full code.  Maretta Bees, fifth  MD. 03/11/2011, 3:51 PM

## 2011-03-11 NOTE — Progress Notes (Signed)
Physical Therapy Treatment Patient Details Name: Jordan Blackwell MRN: [id number] DOB: [birth year] Today's Date: 03/11/2011  PT Assessment/Plan  PT - Assessment/Plan Comments on Treatment Session: Patient independent with gait.  Assessed safety with cane - uses cane properly (uses for long distances only).  Scored 22/24 on DGI balance assessment.  Of note, pt complaining of pain in right foot - started yesterday.  RN notified. PT Plan: Discharge plan remains appropriate;Frequency remains appropriate PT Frequency: Min 3X/week Follow Up Recommendations: None Equipment Recommended: None recommended by PT PT Goals  Additional Goals PT Goal: Additional Goal #1 - Progress: Progressing toward goal (Scored 22/24 today)  PT Treatment Precautions/Restrictions  Restrictions Weight Bearing Restrictions: No Mobility (including Balance) Bed Mobility Bed Mobility: Yes Sit to Supine - Right: 7: Independent;HOB flat Transfers Transfers: Yes Sit to Stand: 7: Independent;With upper extremity assist;From chair/3-in-1 Stand to Sit: 7: Independent;With upper extremity assist;To bed Ambulation/Gait Ambulation/Gait: Yes Ambulation/Gait Assistance: 7: Independent Ambulation/Gait Assistance Details (indicate cue type and reason): Patient with slow gait speed and slightly antalgic gait (chronic left foot fx/repair, acute right foot pain) Ambulation Distance (Feet): 400 Feet Assistive device: Straight cane (Most of gait without AD.  Assessed safety with cane 50') Gait Pattern: Step-through pattern;Antalgic Gait velocity: slow gait speed Stairs: No  Posture/Postural Control Posture/Postural Control: No significant limitations Balance Balance Assessed: Yes Dynamic Gait Index Level Surface: Normal Change in Gait Speed: Normal Gait with Horizontal Head Turns: Normal Gait with Vertical Head Turns: Normal Gait and Pivot Turn: Mild Impairment Step Over Obstacle: Normal Step Around Obstacles: Normal Steps:  Mild Impairment Total Score: 22  Exercise    End of Session PT - End of Session Equipment Utilized During Treatment: Gait belt Activity Tolerance: Patient tolerated treatment well Patient left: in bed;with call bell in reach General Behavior During Session: [facility] for tasks performed Cognition: [facility] for tasks performed  Vena Austria [phone] 03/11/2011, 10:54 AM

## 2011-03-11 NOTE — Progress Notes (Signed)
Patient ID: Jordan Blackwell, male   DOB: 04-09-1929, 77 y.o.   MRN: [id number]    Subjective: Passing allot of gas and BM yesterday, solid stool, not diarrhea.  Objective: Vital signs in last 24 hours: Temp:  [97.8 F (36.6 C)-98.1 F (36.7 C)] 97.8 F (36.6 C) (12/07 0357) Pulse Rate:  [78-84] 79  (12/07 0357) Resp:  [18] 18  (12/07 0357) BP: (110-132)/(60-78) 132/78 mmHg (12/07 0357) SpO2:  [94 %-96 %] 94 % (12/07 0357) Weight:  [65 kg (143 lb 4.8 oz)] 143 lb 4.8 oz (65 kg) (12/07 0357) Last BM Date: 03/10/11  Intake/Output from previous day: 12/06 0701 - 12/07 0700 In: 2864.4 [P.O.:1620; I.V.:1234.4; IV Piggyback:10] Out: 1776 [Urine:1775; Stool:1] Intake/Output this shift: Total I/O In: 360 [P.O.:360] Out: 200 [Urine:200]  General appearance: alert Resp: clear to auscultation bilaterally GI: soft, NT, ND, +bs  Lab Results:   [facility] 03/11/11 0640 03/10/11 0710  WBC 9.0 9.8  HGB 12.5* 12.4*  HCT 36.3* 36.7*  PLT 177 157   BMET  Basename 03/11/11 0640 03/10/11 0710  NA 139 137  K 3.6 3.5  CL 101 101  CO2 30 32  GLUCOSE 103* 119*  BUN 8 11  CREATININE 0.99 1.03  CALCIUM 9.2 8.5   PT/INR No results found for this basename: LABPROT:2,INR:2 in the last 72 hours ABG No results found for this basename: PHART:2,PCO2:2,PO2:2,HCO3:2 in the last 72 hours  Studies/Results: No results found.  Anti-infectives: Anti-infectives    None      Assessment/Plan: SBO resolving - will continue to advance diet to soft low residue.   Cardiology input noted   LOS: 4 days    Jordan Blackwell 03/11/2011

## 2011-03-11 NOTE — Progress Notes (Signed)
Subjective:  Doing well.  Small BM last night.  No chest pain. Mild dyspnea.  Objective:  Vital Signs in the last 24 hours: Temp:  [97.8 F (36.6 C)-98.1 F (36.7 C)] 97.8 F (36.6 C) (12/07 0357) Pulse Rate:  [78-84] 79  (12/07 0357) Resp:  [18] 18  (12/07 0357) BP: (110-132)/(60-78) 132/78 mmHg (12/07 0357) SpO2:  [94 %-96 %] 94 % (12/07 0357) Weight:  [143 lb 4.8 oz (65 kg)] 143 lb 4.8 oz (65 kg) (12/07 0357)  Intake/Output from previous day: 12/06 0701 - 12/07 0700 In: 2864.4 [P.O.:1620; I.V.:1234.4; IV Piggyback:10] Out: 1776 [Urine:1775; Stool:1] Intake/Output from this shift:       . diltiazem  180 mg Oral Daily  . metoprolol tartrate  50 mg Oral BID  . pantoprazole (PROTONIX) IV  40 mg Intravenous Q12H  . DISCONTD: metoprolol  5 mg Intravenous Q4H      . dextrose 5 % and 0.45 % NaCl with KCl 10 mEq/L 20 mL/hr at 03/10/11 1302  . heparin 950 Units/hr (03/11/11 1610)    Physical Exam: The patient appears to be in no distress.  Head and neck exam reveals that the pupils are equal and reactive.  The extraocular movements are full.  There is no scleral icterus.  Mouth and pharynx are benign.  No lymphadenopathy.  No carotid bruits.  The jugular venous pressure is normal.  Thyroid is not enlarged or tender.  Chest reveals mild inspiratory rales at bases.  Heart reveals no abnormal lift or heave.  First and second heart sounds are normal.  There is Gr 2/6 AI murmur at left sternal edge.  The abdomen is soft and nontender.  Bowel sounds are normoactive.  There is no hepatosplenomegaly or mass.  There are no abdominal bruits.  Extremities reveal no phlebitis or edema.  Pedal pulses are good.  There is no cyanosis or clubbing.  Neurologic exam is normal strength and no lateralizing weakness.  No sensory deficits.  Integument reveals no rash  Lab Results:  Basename 03/11/11 0640 03/10/11 0710  WBC 9.0 9.8  HGB 12.5* 12.4*  PLT 177 157    Basename 03/11/11 0640  03/10/11 0710  NA 139 137  K 3.6 3.5  CL 101 101  CO2 30 32  GLUCOSE 103* 119*  BUN 8 11  CREATININE 0.99 1.03    Basename 03/08/11 1857  TROPONINI <0.30   Hepatic Function Panel No results found for this basename: PROT,ALBUMIN,AST,ALT,ALKPHOS,BILITOT,BILIDIR,IBILI in the last 72 hours No results found for this basename: CHOL in the last 72 hours No results found for this basename: PROTIME in the last 72 hours  Imaging: Imaging results have been reviewed  Cardiac Studies:  Assessment/Plan:  Patient Active Problem List  Diagnoses  . A-fib  . CAD (coronary artery disease)  . S/P CABG (coronary artery bypass graft)  . HTN (hypertension)  . S/P MVR (mitral valve repair)  . SBO (small bowel obstruction)  Patient is improving from acute diastolic CHF.  Weight up 1 lb and still has rales at bases.  Will give Lasix 40 mg again today.    LOS: 4 days    Cassell Clement 03/11/2011, 8:03 AM

## 2011-03-11 NOTE — Progress Notes (Signed)
ANTICOAGULATION CONSULT NOTE - Follow Up Consult  Pharmacy Consult for Heparin Indication: atrial fibrillation  Assessment: 77 yo male on IV heparin for afib while off Pradaxa.  Heparin level decreased to subtherapeutic 0.28. No bleeding noted.  Goal of Therapy:  Heparin level 0.3-0.7 units/ml   Plan:  1. Increase IV heparin rate to 1050 units/hr 2. Check heparin level in AM  Jordan Blackwell M 03/11/2011,2:25 PM  No Known Allergies  Patient Measurements: Height: 5\' 7"  (170.2 cm) Weight: 143 lb 4.8 oz (65 kg) (a scale) IBW/kg (Calculated) : 66.1  Heparin Dosing Weight: 65kg  Vital Signs: Temp: 97.8 F (36.6 C) (12/07 0357) BP: 122/80 mmHg (12/07 1100) Pulse Rate: 79  (12/07 1100)  Labs:  Basename 03/11/11 0640 03/10/11 0710 03/09/11 1758 03/09/11 1120 03/08/11 1857  HGB 12.5* 12.4* -- -- --  HCT 36.3* 36.7* 39.3 -- --  PLT 177 157 160 -- --  APTT -- -- -- -- --  LABPROT -- -- -- -- --  INR -- -- -- -- --  HEPARINUNFRC 0.28* 0.40 0.61 -- --  CREATININE 0.99 1.03 -- 0.95 --  CKTOTAL -- -- -- -- 38  CKMB -- -- -- -- 2.4  TROPONINI -- -- -- -- <0.30   Estimated Creatinine Clearance: 55.6 ml/min (by C-G formula based on Cr of 0.99).   Medications:  Scheduled:    . diltiazem  180 mg Oral Daily  . furosemide  40 mg Oral Once  . metoprolol tartrate  50 mg Oral BID  . pantoprazole (PROTONIX) IV  40 mg Intravenous Q12H

## 2011-03-12 DIAGNOSIS — I4891 Unspecified atrial fibrillation: Secondary | ICD-10-CM

## 2011-03-12 LAB — CBC
MCV: 91.9 fL (ref 78.0–100.0)
Platelets: 190 10*3/uL (ref 150–400)
RBC: 4.18 MIL/uL — ABNORMAL LOW (ref 4.22–5.81)
WBC: 8 10*3/uL (ref 4.0–10.5)

## 2011-03-12 LAB — BASIC METABOLIC PANEL
CO2: 30 mEq/L (ref 19–32)
Chloride: 97 mEq/L (ref 96–112)
GFR calc Af Amer: 74 mL/min — ABNORMAL LOW (ref >90)
Potassium: 3.5 mEq/L (ref 3.5–5.1)
Sodium: 138 mEq/L (ref 135–145)

## 2011-03-12 LAB — HEPARIN LEVEL (UNFRACTIONATED): Heparin Unfractionated: 0.54 IU/mL (ref 0.30–0.70)

## 2011-03-12 MED ORDER — DABIGATRAN ETEXILATE MESYLATE 150 MG PO CAPS: 150.0000 mg | ORAL_CAPSULE | Freq: Two times a day (BID) | ORAL | Status: DC

## 2011-03-12 MED ORDER — DABIGATRAN ETEXILATE MESYLATE 150 MG PO CAPS
150.0000 mg | ORAL_CAPSULE | Freq: Two times a day (BID) | ORAL | Status: DC
  Filled 2011-03-12 (×3): qty 1

## 2011-03-12 NOTE — Progress Notes (Signed)
Subjective:  Doing well.  No chest pain and no SOB  Objective:  Vital Signs in the last 24 hours: Temp:  [97.5 F (36.4 C)-98.2 F (36.8 C)] 98.2 F (36.8 C) (12/08 0529) Pulse Rate:  [72-89] 72  (12/08 0529) Resp:  [16-18] 18  (12/08 0529) BP: (115-128)/(73-82) 126/82 mmHg (12/08 0529) SpO2:  [93 %-96 %] 93 % (12/08 0529) Weight:  [136 lb 11 oz (62 kg)] 136 lb 11 oz (62 kg) (12/08 0529)  Intake/Output from previous day: 12/07 0701 - 12/08 0700 In: 920 [P.O.:920] Out: 2275 [Urine:2275] Intake/Output from this shift: Total I/O In: 360 [P.O.:360] Out: 200 [Urine:200]     . dabigatran  150 mg Oral Q12H  . diltiazem  180 mg Oral Daily  . metoprolol tartrate  50 mg Oral BID  . pantoprazole (PROTONIX) IV  40 mg Intravenous Q12H  . DISCONTD: dabigatran  150 mg Oral Q12H      . DISCONTD: dextrose 5 % and 0.45 % NaCl with KCl 10 mEq/L 20 mL/hr at 03/10/11 1302  . DISCONTD: heparin 1,050 Units/hr (03/11/11 1430)    Physical Exam: The patient appears to be in no distress.  Head and neck exam normal  Chest reveals mild inspiratory rales at bases.  Heart reveals no abnormal lift or heave.  First and second heart sounds are normal.  There is Gr 2/6 AI murmur at left sternal edge. irregular  The abdomen is soft and nontender.  Bowel sounds are normoactive.   Extremities reveal no phlebitis or edema.  Pedal pulses are good.  There is no cyanosis or clubbing.  Neurologic exam is normal strength and no lateralizing weakness.  No sensory deficits.  Integument reveals no rash  Lab Results:  Basename 03/12/11 0652 03/11/11 0640  WBC 8.0 9.0  HGB 13.0 12.5*  PLT 190 177    Basename 03/12/11 0652 03/11/11 0640  NA 138 139  K 3.5 3.6  CL 97 101  CO2 30 30  GLUCOSE 105* 103*  BUN 11 8  CREATININE 1.07 0.99   Assessment/Plan:  Patient Active Problem List  Diagnoses  . A-fib Continue meds for rate control and pradaxa. Patient can be dced from cardiac standpoint and fu  with his cardiologist in [facility]  . CAD (coronary artery disease) would not use asa given pradaxa use; consider statin as outpatient.  . S/P CABG (coronary artery bypass graft)  . HTN (hypertension)  . S/P MVR (mitral valve repair)  . SBO (small bowel obstruction)   Please call with questions   LOS: 5 days    Olga Millers 03/12/2011, 11:47 AM

## 2011-03-12 NOTE — Discharge Summary (Signed)
PATIENT DETAILS Name: Jordan Blackwell Age: 77 y.o. Sex: male Date of Birth: Jan 01, 1930 MRN: [id number]. Admit Date: 03/07/2011 Admitting Physician: Sorin Laza WUJ:[id number], [name], MD  PRIMARY DISCHARGE DIAGNOSIS:  Principal Problem:  *SBO (small bowel obstruction)-resolved  Active Problems:  A-fib  CAD (coronary artery disease) Diastolic CHF-now better compensated  S/P CABG (coronary artery bypass graft)  HTN (hypertension)  S/P MVR (mitral valve repair)      PAST MEDICAL HISTORY: Past Medical History  Diagnosis Date  . Coronary artery disease   . Stroke   . Atrial fibrillation   . Atrial thrombus   . Hypertension   . B12 deficiency   . Folic acid deficiency     DISCHARGE MEDICATIONS: Current Discharge Medication List    CONTINUE these medications which have NOT CHANGED   Details  dabigatran (PRADAXA) 150 MG CAPS Take 150 mg by mouth every 12 (twelve) hours.      diltiazem (TIAZAC) 180 MG 24 hr capsule Take 180 mg by mouth daily.      lisinopril (PRINIVIL,ZESTRIL) 2.5 MG tablet Take 2.5 mg by mouth daily.      metoprolol (LOPRESSOR) 50 MG tablet Take 75 mg by mouth 2 (two) times daily.     Multiple Vitamin (MULTIVITAMIN) tablet Take 1 tablet by mouth daily.      pantoprazole (PROTONIX) 40 MG tablet Take 40 mg by mouth daily.           BRIEF HPI:  See H&P, Labs, Consult and Test reports for all details in brief, patient was admitted for abdominal distention and vomiting for one day. Patient was found to have SBO and then admitted to the hospitalist service, as patient has significant medical issues,  CONSULTATIONS:   cardiology and general surgery  PERTINENT RADIOLOGIC STUDIES: Dg Chest 2 View  03/09/2011  *RADIOLOGY REPORT*  Clinical Data: Atrial fibrillation.  Cough. Shortness of breath.  CHEST - 2 VIEW  Comparison: 03/07/2011.  Findings: Nasogastric tube side hole at the distal esophagus level with the tip just beyond the expected gastroesophageal  junction. This may need to be advanced.  Pulmonary vascular congestion superimposed on chronic lung changes suspected.  Follow-up until clearance recommended.  Since the prior examination, there has been slight improved aeration of the left base.  Calcified mildly tortuous aorta.  IMPRESSION: Slight improvement in degree of aeration of the left base.  Nasogastric tube may need to be repositioned as discussed above.  Original Report Authenticated By: Fuller Canada, M.D.   Ct Abdomen Pelvis W Contrast  03/07/2011  *RADIOLOGY REPORT*  Clinical Data: Abdominal pain.  Coronary artery disease. Cholecystectomy.  CT ABDOMEN AND PELVIS WITH CONTRAST  Technique:  Multidetector CT imaging of the abdomen and pelvis was performed following the standard protocol during bolus administration of intravenous contrast.  Contrast: OMNIPAQUE IOHEXOL 300 MG/ML IV SOLN  Comparison: None  Findings: There is a reticular pattern at the lung bases consistent with mild interstitial lung disease.  No focal consolidation. Heart is enlarged.  No pericardial fluid.  There is no focal hepatic lesion.  There is mild intrahepatic biliary duct dilatation likely related to prior cholecystectomy. Pancreas, spleen, and adrenal glands are normal.  There is severe cortical thinning of the left kidney which may relate to prior infarction.  There is simple cyst of the left kidney.  There is cortical scarring of the right kidney additionally.  Several simple right renal cysts are present.  The stomach is moderately distended with the oral contrast.  The duodenum is not dilated.  The third portion the duodenum does not cross the spine and turns back towards the right upper quadrant (image 38).  The majority of the small bowel is in the right upper quadrant.  These findings are consistent with malrotation.  There are dilated loops of proximal small bowel up to 4 cm.  The distal ileum is collapsed at 10 mm.  There is a long segment of collapsed ileum.   There is no clear transition point and  there are multiple dilated loops of proximal small bowel.  There is enhancement within the bowel wall and prominence of the vessels within the mesentery. No significant bowel wall thickening.  No portal venous gas.  The colon is predominately collapsed without evidence of mass lesion.  There are diverticula in the sigmoid colon which is severe.  Stool ball in the rectum.  Abdominal aorta normal caliber.  There is no evidence of portal venous gas.  No evidence of intraperitoneal free air.  No free fluid the pelvis.  The bladder prostate gland are normal. Review of  bone windows demonstrates no aggressive osseous lesions.  IMPRESSION:  1.  Findings consistent with early mechanical small bowel obstruction.  Etiology would include adhesions or malrotation. 2.  The patient has malrotated small bowel anatomy. 3.  Enhancement of the small bowel mucosa with vascular engorgement.  No  portal venous gas or significant bowel wall thickening.  Recommend close observation for ischemia.  4.  Severe sigmoid diverticulosis without diverticulitis. 5.  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  Findings discussed Dr. Rosalia Hammers on 03/07/2011 at 12:20 p.m.  Original Report Authenticated By: Genevive Bi, M.D.   Dg Chest Port 1 View  03/07/2011  *RADIOLOGY REPORT*  Clinical Data: Nasogastric placement  PORTABLE CHEST - 1 VIEW  Comparison: None.  Findings: Nasogastric tube enters the stomach.  The lungs show abnormal interstitial markings that could be due to chronic scarring or interstitial edema.  IMPRESSION: Nasogastric tube enters the stomach.  Abnormal interstitial pulmonary density could be scarring or edema.  Original Report Authenticated By: Thomasenia Sales, M.D.   Dg Abd 2 Views  03/09/2011  *RADIOLOGY REPORT*  Clinical Data: Small bowel obstruction.  ABDOMEN - 2 VIEW  Comparison: 03/07/2011 CT.  Findings: Nasogastric tube has been placed with the side hole at the distal  esophagus level and the tip at the proximal fundus level.  This may need to be advanced.  No plain film evidence of bowel obstruction.  The possibility of free intraperitoneal air cannot be addressed on a supine view.  Prominent lung changes most notable on the left.  IMPRESSION: No plain film evidence of small bowel obstruction.  Nasogastric tube may need to be repositioned as noted above.  Original Report Authenticated By: Fuller Canada, M.D.     PERTINENT LAB RESULTS: CBC:  Basename 03/12/11 0652 03/11/11 0640  WBC 8.0 9.0  HGB 13.0 12.5*  HCT 38.4* 36.3*  PLT 190 177   CMET CMP     Component Value Date/Time   NA 138 03/12/2011 0652   K 3.5 03/12/2011 0652   CL 97 03/12/2011 0652   CO2 30 03/12/2011 0652   GLUCOSE 105* 03/12/2011 0652   BUN 11 03/12/2011 0652   CREATININE 1.07 03/12/2011 0652   CALCIUM 9.1 03/12/2011 0652   PROT 6.8 03/08/2011 0515   ALBUMIN 3.2* 03/08/2011 0515   AST 19 03/08/2011 0515   ALT 11 03/08/2011 0515   ALKPHOS 59 03/08/2011  0515   BILITOT 0.7 03/08/2011 0515   GFRNONAA 64* 03/12/2011 0652   GFRAA 74* 03/12/2011 0652    GFR Estimated Creatinine Clearance: 49.1 ml/min (by C-G formula based on Cr of 1.07). No results found for this basename: LIPASE:2,AMYLASE:2 in the last 72 hours No results found for this basename: CKTOTAL:3,CKMB:3,CKMBINDEX:3,TROPONINI:3 in the last 72 hours No results found for this basename: POCBNP:3 in the last 72 hours No results found for this basename: DDIMER:2 in the last 72 hours No results found for this basename: HGBA1C:2 in the last 72 hours No results found for this basename: CHOL:2,HDL:2,LDLCALC:2,TRIG:2,CHOLHDL:2,LDLDIRECT:2 in the last 72 hours No results found for this basename: TSH,T4TOTAL,FREET3,T3FREE,THYROIDAB in the last 72 hours No results found for this basename: VITAMINB12:2,FOLATE:2,FERRITIN:2,TIBC:2,IRON:2,RETICCTPCT:2 in the last 72 hours Coags: No results found for this basename: PT:2,INR:2 in the last 72  hours Microbiology: No results found for this or any previous visit (from the past 240 hour(s)).   BRIEF HOSPITAL COURSE:  Principal Problem:  *SBO (small bowel obstruction) - this was likely a partial SBO -patient was admitted and kept NPO, given IVF and other supportive measures. -he also required a NG tube placed -with supportive care he slowly improved, started passing flatus. -NG tube was then removed, liquid diet was started and slowly advanced to a low residual diet which the patient has tolerated well. He had a small BM yesterday, and is passing profuse Flatus today. -he will be discharged home later today  Active Problems:  A-fib -this is chronic -he was transitioned from Pradaxa to heparin infusion. However on discharge he will be resumed on pradaxa. -cardiology was consulted and they did help manage his rate -his beta blockers and cardizem will be resumed on discharge   CAD (coronary artery disease) - stable, he will follow up with his PCP and primary cardiologist for further optimization of his medications.    HTN (hypertension) -stable with oral medications  CHF-Diastolic -he had mild rales on exam- suggesting mild decompensation. He did receive a few doses of lasix during his stay here. He is currently better and compensated.   TODAY-DAY OF DISCHARGE:  Subjective:   Jordan Blackwell today has no headache,no chest abdominal pain,no new weakness tingling or numbness, feels much better wants to go home today. He has tolerated a low residue diet.  Objective:   Blood pressure 126/82, pulse 72, temperature 98.2 F (36.8 C), temperature source Oral, resp. rate 18, height 5\' 7"  (1.702 m), weight 62 kg (136 lb 11 oz), SpO2 93.00%.  Intake/Output Summary (Last 24 hours) at 03/12/11 1139 Last data filed at 03/12/11 0857  Gross per 24 hour  Intake    920 ml  Output   1975 ml  Net  -1055 ml    Exam Awake Alert, Oriented *3, No new F.N deficits, Normal  affect [place].AT,PERRAL Supple Neck,No JVD, No cervical lymphadenopathy appriciated.  Symmetrical Chest wall movement, Good air movement bilaterally, CTAB RRR,No Gallops,Rubs or new Murmurs, No Parasternal Heave +ve B.Sounds, Abd Soft, Non tender, No organomegaly appriciated, No rebound -guarding or rigidity. No Cyanosis, Clubbing or edema, No new Rash or bruise  DISPOSITION:   DISCHARGE INSTRUCTIONS:    Follow-up Information    Follow up with Garth Schlatter. Make an appointment in 1 week.   Contact information:   [facility] [address] [place] [address] [phone]          Total Time spent on discharge equals 45 minutes.  SignedJeoffrey Massed 03/12/2011  11:39 AM

## 2011-03-12 NOTE — Progress Notes (Signed)
  Subjective: Small bm, passing flatus, tol low fiber diet without n/v  Objective: Vital signs in last 24 hours: Temp:  [97.5 F (36.4 C)-98.2 F (36.8 C)] 98.2 F (36.8 C) (12/08 0529) Pulse Rate:  [72-89] 72  (12/08 0529) Resp:  [16-18] 18  (12/08 0529) BP: (115-128)/(73-82) 126/82 mmHg (12/08 0529) SpO2:  [93 %-96 %] 93 % (12/08 0529) Weight:  [136 lb 11 oz (62 kg)] 136 lb 11 oz (62 kg) (12/08 0529) Last BM Date: 03/10/11  Intake/Output from previous day: 12/07 0701 - 12/08 0700 In: 920 [P.O.:920] Out: 2275 [Urine:2275] Intake/Output this shift: Total I/O In: 360 [P.O.:360] Out: 200 [Urine:200]  GI: soft, nontender  Lab Results:   [facility] 03/12/11 0652 03/11/11 0640  WBC 8.0 9.0  HGB 13.0 12.5*  HCT 38.4* 36.3*  PLT 190 177   BMET  Basename 03/12/11 0652 03/11/11 0640  NA 138 139  K 3.5 3.6  CL 97 101  CO2 30 30  GLUCOSE 105* 103*  BUN 11 8  CREATININE 1.07 0.99  CALCIUM 9.1 9.2   PT/INR No results found for this basename: LABPROT:2,INR:2 in the last 72 hours ABG No results found for this basename: PHART:2,PCO2:2,PO2:2,HCO3:2 in the last 72 hours  Studies/Results: No results found.  Anti-infectives: Anti-infectives    None      Assessment/Plan: Resolving pSBO Will sign off, tol diet and having bowel function Please call back if needed  LOS: 5 days    [place] 03/12/2011

## 2011-03-14 NOTE — Progress Notes (Signed)
   CARE MANAGEMENT NOTE 03/14/2011  Patient:  Jordan Blackwell, Jordan Blackwell   Account Number:  [id number]  Date Initiated:  03/08/2011  a Subjective/Objective Assessment:   Pt admitted with afib, chest pain, abdominal pain     Action/Plan:   PTA pt lived at home alone, was independent with ADLs   Anticipated DC Date:  03/10/2011   Anticipated DC Plan:  HOME/SELF CARE      DC Planning Services  CM consult      Choice offered to / List presented to:             Status of service:  Completed, signed off Medicare Important Message given?   (If response is "NO", the following Medicare IM given date fields will be blank) Date Medicare IM given:   Date Additional Medicare IM given:    Discharge Disposition:  HOME/SELF CARE  Per UR Regulation:  Reviewed for med. necessity/level of care/duration of stay  Comments:  PCP- Annett FabianPura Spice  03/08/11- 1220- Donn Pierini RN, BSN [phone] Pt with NGT for SBO, surgery following- CM to follow for potential d/c needs as pt progresses.

## 2013-04-06 ENCOUNTER — Inpatient Hospital Stay (HOSPITAL_COMMUNITY)
Admission: EM | Admit: 2013-04-06 | Discharge: 2013-04-12 | DRG: 308 | Disposition: A | Discharge status: Home / Self Care | Payer: Medicare Other | Attending: Cardiology | Admitting: Cardiology

## 2013-04-06 ENCOUNTER — Emergency Department (HOSPITAL_COMMUNITY): Payer: Medicare Other

## 2013-04-06 ENCOUNTER — Encounter (HOSPITAL_COMMUNITY): Payer: Self-pay | Admitting: Emergency Medicine

## 2013-04-06 DIAGNOSIS — I2510 Atherosclerotic heart disease of native coronary artery without angina pectoris: Secondary | ICD-10-CM | POA: Diagnosis present

## 2013-04-06 DIAGNOSIS — Z7901 Long term (current) use of anticoagulants: Secondary | ICD-10-CM

## 2013-04-06 DIAGNOSIS — I495 Sick sinus syndrome: Secondary | ICD-10-CM | POA: Diagnosis present

## 2013-04-06 DIAGNOSIS — Z87891 Personal history of nicotine dependence: Secondary | ICD-10-CM

## 2013-04-06 DIAGNOSIS — Z602 Problems related to living alone: Secondary | ICD-10-CM

## 2013-04-06 DIAGNOSIS — Z9849 Cataract extraction status, unspecified eye: Secondary | ICD-10-CM

## 2013-04-06 DIAGNOSIS — N179 Acute kidney failure, unspecified: Secondary | ICD-10-CM | POA: Diagnosis not present

## 2013-04-06 DIAGNOSIS — Z8673 Personal history of transient ischemic attack (TIA), and cerebral infarction without residual deficits: Secondary | ICD-10-CM

## 2013-04-06 DIAGNOSIS — R911 Solitary pulmonary nodule: Secondary | ICD-10-CM

## 2013-04-06 DIAGNOSIS — R079 Chest pain, unspecified: Secondary | ICD-10-CM | POA: Diagnosis present

## 2013-04-06 DIAGNOSIS — Z951 Presence of aortocoronary bypass graft: Secondary | ICD-10-CM

## 2013-04-06 DIAGNOSIS — J84112 Idiopathic pulmonary fibrosis: Secondary | ICD-10-CM | POA: Diagnosis present

## 2013-04-06 DIAGNOSIS — I10 Essential (primary) hypertension: Secondary | ICD-10-CM | POA: Diagnosis present

## 2013-04-06 DIAGNOSIS — R64 Cachexia: Secondary | ICD-10-CM | POA: Diagnosis present

## 2013-04-06 DIAGNOSIS — Z8249 Family history of ischemic heart disease and other diseases of the circulatory system: Secondary | ICD-10-CM

## 2013-04-06 DIAGNOSIS — I5031 Acute diastolic (congestive) heart failure: Secondary | ICD-10-CM | POA: Diagnosis present

## 2013-04-06 DIAGNOSIS — Z79899 Other long term (current) drug therapy: Secondary | ICD-10-CM

## 2013-04-06 DIAGNOSIS — Z6379 Other stressful life events affecting family and household: Secondary | ICD-10-CM

## 2013-04-06 DIAGNOSIS — I482 Chronic atrial fibrillation, unspecified: Secondary | ICD-10-CM | POA: Diagnosis present

## 2013-04-06 DIAGNOSIS — Z803 Family history of malignant neoplasm of breast: Secondary | ICD-10-CM

## 2013-04-06 DIAGNOSIS — Z9889 Other specified postprocedural states: Secondary | ICD-10-CM

## 2013-04-06 DIAGNOSIS — I959 Hypotension, unspecified: Secondary | ICD-10-CM | POA: Diagnosis present

## 2013-04-06 DIAGNOSIS — I509 Heart failure, unspecified: Secondary | ICD-10-CM | POA: Diagnosis present

## 2013-04-06 DIAGNOSIS — Z9089 Acquired absence of other organs: Secondary | ICD-10-CM

## 2013-04-06 DIAGNOSIS — E538 Deficiency of other specified B group vitamins: Secondary | ICD-10-CM | POA: Diagnosis present

## 2013-04-06 DIAGNOSIS — I4891 Unspecified atrial fibrillation: Principal | ICD-10-CM

## 2013-04-06 HISTORY — DX: Nonrheumatic mitral (valve) insufficiency: I34.0

## 2013-04-06 LAB — CBC
HCT: 43 % (ref 39.0–52.0)
Hemoglobin: 14.9 g/dL (ref 13.0–17.0)
MCH: 32 pg (ref 26.0–34.0)
MCHC: 34.7 g/dL (ref 30.0–36.0)
MCV: 92.5 fL (ref 78.0–100.0)
PLATELETS: 196 10*3/uL (ref 150–400)
RBC: 4.65 MIL/uL (ref 4.22–5.81)
RDW: 13 % (ref 11.5–15.5)
WBC: 10.7 10*3/uL — ABNORMAL HIGH (ref 4.0–10.5)

## 2013-04-06 LAB — COMPREHENSIVE METABOLIC PANEL
ALBUMIN: 3.7 g/dL (ref 3.5–5.2)
ALK PHOS: 75 U/L (ref 39–117)
ALT: 17 U/L (ref 0–53)
AST: 31 U/L (ref 0–37)
BILIRUBIN TOTAL: 1 mg/dL (ref 0.3–1.2)
BUN: 15 mg/dL (ref 6–23)
CHLORIDE: 97 meq/L (ref 96–112)
CO2: 26 meq/L (ref 19–32)
CREATININE: 0.89 mg/dL (ref 0.50–1.35)
Calcium: 9.9 mg/dL (ref 8.4–10.5)
GFR calc non Af Amer: 78 mL/min — ABNORMAL LOW (ref >90)
GLUCOSE: 112 mg/dL — AB (ref 70–99)
POTASSIUM: 4.2 meq/L (ref 3.7–5.3)
Sodium: 136 mEq/L — ABNORMAL LOW (ref 137–147)
Total Protein: 7.8 g/dL (ref 6.0–8.3)

## 2013-04-06 LAB — POCT I-STAT TROPONIN I: Troponin i, poc: 0.01 ng/mL (ref 0.00–0.08)

## 2013-04-06 LAB — TROPONIN I
Troponin I: <0.3 ng/mL (ref <0.30)
Troponin I: <0.3 ng/mL (ref <0.30)

## 2013-04-06 LAB — APTT: aPTT: 68 seconds — ABNORMAL HIGH (ref 24–37)

## 2013-04-06 LAB — PROTIME-INR
INR: 1.89 — ABNORMAL HIGH (ref 0.00–1.49)
Prothrombin Time: 21.1 seconds — ABNORMAL HIGH (ref 11.6–15.2)

## 2013-04-06 LAB — PRO B NATRIURETIC PEPTIDE: PRO B NATRI PEPTIDE: 2992 pg/mL — AB (ref 0–450)

## 2013-04-06 MED ORDER — DILTIAZEM HCL 100 MG IV SOLR
5.0000 mg/h | INTRAVENOUS | Status: DC
  Administered 2013-04-06: 5 mg/h via INTRAVENOUS
  Filled 2013-04-06: qty 100

## 2013-04-06 MED ORDER — DILTIAZEM HCL 25 MG/5ML IV SOLN
10.0000 mg | Freq: Once | INTRAVENOUS | Status: AC
  Administered 2013-04-06: 10 mg via INTRAVENOUS
  Filled 2013-04-06: qty 5

## 2013-04-06 MED ORDER — DILTIAZEM HCL ER BEADS 180 MG PO CP24: 180.0000 mg | ORAL_CAPSULE | Freq: Every day | ORAL | Status: DC

## 2013-04-06 MED ORDER — INFLUENZA VAC SPLIT QUAD 0.5 ML IM SUSP
0.5000 mL | INTRAMUSCULAR | Status: AC
  Administered 2013-04-07: 0.5 mL via INTRAMUSCULAR
  Filled 2013-04-06: qty 0.5

## 2013-04-06 MED ORDER — TRAMADOL HCL 50 MG PO TABS
50.0000 mg | ORAL_TABLET | Freq: Two times a day (BID) | ORAL | Status: DC | PRN
  Administered 2013-04-06: 50 mg via ORAL
  Filled 2013-04-06: qty 1

## 2013-04-06 MED ORDER — SODIUM CHLORIDE 0.9 % IJ SOLN
3.0000 mL | INTRAMUSCULAR | Status: DC | PRN
  Administered 2013-04-06: 3 mL via INTRAVENOUS

## 2013-04-06 MED ORDER — LISINOPRIL 2.5 MG PO TABS
2.5000 mg | ORAL_TABLET | Freq: Every day | ORAL | Status: DC
  Administered 2013-04-07: 2.5 mg via ORAL
  Filled 2013-04-06 (×2): qty 1

## 2013-04-06 MED ORDER — SODIUM CHLORIDE 0.9 % IJ SOLN
3.0000 mL | Freq: Two times a day (BID) | INTRAMUSCULAR | Status: DC
  Administered 2013-04-06 – 2013-04-11 (×10): 3 mL via INTRAVENOUS

## 2013-04-06 MED ORDER — ONDANSETRON HCL 4 MG/2ML IJ SOLN
4.0000 mg | Freq: Four times a day (QID) | INTRAMUSCULAR | Status: DC | PRN
  Administered 2013-04-06 – 2013-04-08 (×3): 4 mg via INTRAVENOUS
  Filled 2013-04-06 (×3): qty 2

## 2013-04-06 MED ORDER — PANTOPRAZOLE SODIUM 40 MG PO TBEC
40.0000 mg | DELAYED_RELEASE_TABLET | Freq: Every day | ORAL | Status: DC
  Administered 2013-04-07 – 2013-04-12 (×5): 40 mg via ORAL
  Filled 2013-04-06 (×5): qty 1

## 2013-04-06 MED ORDER — MORPHINE SULFATE 2 MG/ML IJ SOLN
2.0000 mg | Freq: Four times a day (QID) | INTRAMUSCULAR | Status: DC | PRN
  Administered 2013-04-06 – 2013-04-08 (×3): 2 mg via INTRAVENOUS
  Filled 2013-04-06 (×3): qty 1

## 2013-04-06 MED ORDER — SODIUM CHLORIDE 0.9 % IV SOLN: 250.0000 mL | INTRAVENOUS | Status: DC | PRN

## 2013-04-06 MED ORDER — ACETAMINOPHEN 325 MG PO TABS: 650.0000 mg | ORAL_TABLET | ORAL | Status: DC | PRN

## 2013-04-06 MED ORDER — NITROGLYCERIN 0.4 MG SL SUBL
0.4000 mg | SUBLINGUAL_TABLET | SUBLINGUAL | Status: DC | PRN
  Administered 2013-04-06 (×3): 0.4 mg via SUBLINGUAL
  Filled 2013-04-06: qty 25

## 2013-04-06 MED ORDER — FUROSEMIDE 10 MG/ML IJ SOLN
20.0000 mg | Freq: Once | INTRAMUSCULAR | Status: AC
  Administered 2013-04-06: 20 mg via INTRAVENOUS
  Filled 2013-04-06: qty 2

## 2013-04-06 MED ORDER — DABIGATRAN ETEXILATE MESYLATE 150 MG PO CAPS
150.0000 mg | ORAL_CAPSULE | Freq: Two times a day (BID) | ORAL | Status: DC
  Administered 2013-04-06 – 2013-04-07 (×3): 150 mg via ORAL
  Filled 2013-04-06 (×5): qty 1

## 2013-04-06 MED ORDER — DILTIAZEM HCL ER COATED BEADS 180 MG PO CP24
180.0000 mg | ORAL_CAPSULE | Freq: Every day | ORAL | Status: DC
  Administered 2013-04-07 – 2013-04-10 (×4): 180 mg via ORAL
  Filled 2013-04-06 (×5): qty 1

## 2013-04-06 NOTE — Progress Notes (Signed)
Called ED for report at 1502; ED has # and will call back.   Jordan Blackwell, Jordan Blackwell

## 2013-04-06 NOTE — ED Notes (Addendum)
Pt from pcp. C/o cp x 2.5 days. Pt states radiaction to left neck. Pt is rapid afib, rate 120-170/ Pt recoeved 324 asa, 10 mg Cardizem, and 1 nitro with relief of chest pain via ems. No signs of distress. Pt recently taken off beta blocker

## 2013-04-06 NOTE — ED Notes (Addendum)
Cardizem stopped per Dr. Anitra LauthPlunkett; pt HR under 100 bpm with consistency; remains in atrial fibrillation with occasional PVCs

## 2013-04-06 NOTE — ED Provider Notes (Signed)
CSN: [id number]     Arrival date & time 04/06/13  1108 History   First MD Initiated Contact with Patient 04/06/13 1113     Chief Complaint  Patient presents with  . Chest Pain   (Consider location/radiation/quality/duration/timing/severity/associated sxs/prior Treatment) Patient is a 80 y.o. male presenting with chest pain. The history is provided by the patient.  Chest Pain Chest pain location: some in the substernal area but mostly in the shoulder blade. Pain quality: sharp and tightness   Pain radiates to the back: yes   Pain severity:  Moderate Onset quality:  Gradual Timing:  Intermittent Progression:  Resolved Chronicity:  New Context comment:  States it is worse with movement Relieved by:  Nothing Worsened by:  Movement Ineffective treatments:  None tried Associated symptoms: back pain and shortness of breath   Associated symptoms: no abdominal pain, no anorexia, no cough, no dizziness, no fever, no nausea, no palpitations and no weakness   Risk factors: coronary artery disease and hypertension   Risk factors: no diabetes mellitus and no smoking     Past Medical History  Diagnosis Date  . Coronary artery disease   . Stroke   . Atrial fibrillation   . Atrial thrombus   . Hypertension   . B12 deficiency   . Folic acid deficiency    Past Surgical History  Procedure Laterality Date  . Inguinal hernia repair    . Coronary artery bypass graft      1993  . Mitral valve anuloplasty    . Cholecystectomy    . Ankle fracture surgery    . Appendectomy    . Abdominal adhesion surgery     History reviewed. No pertinent family history. History  Substance Use Topics  . Smoking status: Former Games developer  . Smokeless tobacco: Never Used  . Alcohol Use: No    Review of Systems  Constitutional: Negative for fever.  Respiratory: Positive for shortness of breath. Negative for cough.   Cardiovascular: Positive for chest pain. Negative for palpitations.  Gastrointestinal:  Negative for nausea, abdominal pain and anorexia.  Genitourinary: Negative for dysuria.  Musculoskeletal: Positive for back pain.  Neurological: Negative for dizziness and weakness.  All other systems reviewed and are negative.    Allergies  Review of patient's allergies indicates no known allergies.  Home Medications   Current Outpatient Rx  Name  Route  Sig  Dispense  Refill  . dabigatran (PRADAXA) 150 MG CAPS   Oral   Take 150 mg by mouth every 12 (twelve) hours.           Marland Kitchen diltiazem (TIAZAC) 180 MG 24 hr capsule   Oral   Take 180 mg by mouth daily.           Marland Kitchen lisinopril (PRINIVIL,ZESTRIL) 2.5 MG tablet   Oral   Take 2.5 mg by mouth daily.           . Multiple Vitamin (MULTIVITAMIN) tablet   Oral   Take 1 tablet by mouth daily.           . pantoprazole (PROTONIX) 40 MG tablet   Oral   Take 40 mg by mouth daily.            BP 111/82  Pulse 98  Temp(Src) 98.2 F (36.8 C) (Oral)  Resp 21  SpO2 100% Physical Exam  Nursing note and vitals reviewed. Constitutional: He is oriented to person, place, and time. He appears well-developed and well-nourished. No distress.  HENT:  Head:  Normocephalic and atraumatic.  Mouth/Throat: Oropharynx is clear and moist.  Eyes: Conjunctivae and EOM are normal. Pupils are equal, round, and reactive to light.  Neck: Normal range of motion. Neck supple.  Cardiovascular: Intact distal pulses.  An irregularly irregular rhythm present. Tachycardia present.   No murmur heard. Pulmonary/Chest: Effort normal. No respiratory distress. He has no wheezes. He has rales in the right lower field and the left lower field.  Abdominal: Soft. He exhibits no distension. There is no tenderness. There is no rebound and no guarding.  Musculoskeletal: Normal range of motion. He exhibits no edema and no tenderness.  Neurological: He is alert and oriented to person, place, and time.  Skin: Skin is warm and dry. No rash noted. No erythema.   Psychiatric: He has a normal mood and affect. His behavior is normal.    ED Course  Procedures (including critical care time) Labs Review Labs Reviewed  CBC - Abnormal; Notable for the following:    WBC 10.7 (*)    All other components within normal limits  PRO B NATRIURETIC PEPTIDE - Abnormal; Notable for the following:    Pro B Natriuretic peptide (BNP) 2992.0 (*)    All other components within normal limits  COMPREHENSIVE METABOLIC PANEL - Abnormal; Notable for the following:    Sodium 136 (*)    Glucose, Bld 112 (*)    GFR calc non Af Amer 78 (*)    All other components within normal limits  PROTIME-INR  APTT  POCT I-STAT TROPONIN I   Imaging Review Dg Chest Port 1 View  04/06/2013   CLINICAL DATA:  Shortness of breath for several months.  EXAM: PORTABLE CHEST - 1 VIEW  COMPARISON:  03/09/2011  FINDINGS: Again noted are diffuse interstitial lung densities that appear chronic. There is slightly increased parenchymal disease in the left lung which could represent an acute process. Heart size is grossly stable with median sternotomy wires. No evidence for a pneumothorax.  IMPRESSION: Chronic interstitial lung densities with slightly increased densities in the left mid and lower lung region. This could represent acute on chronic disease at this location. Overall, there has been minimal change since 03/09/2011.   Electronically Signed   By: Richarda OverlieAdam  Henn M.D.   On: 04/06/2013 12:10    EKG Interpretation    Date/Time:  Saturday April 06 2013 11:10:27 EST Ventricular Rate:  149 PR Interval:    QRS Duration: 141 QT Interval:  336 QTC Calculation: 529 R Axis:   100 Text Interpretation:  Atrial fibrillation with rapid ventricular response Right bundle branch block No significant change since last tracing Confirmed by Anitra LauthPLUNKETT  MD, Adriyana Greenbaum ([id number]) on 04/06/2013 12:05:37 PM            MDM   1. Atrial fibrillation with RVR     Patient with a history of coronary artery disease,  atrial fibrillation recently been [facility] and is currently waiting for Holter monitor results. Apparently in the last month he's had with heart rates ranging from 150 to the 30s. Approximately one week ago he was taken off his metoprolol to and continued on diltiazem. For 12 hours patient heart rate has been consistently 140-150. For the last 3 days he's had some intermittent shoulder blade pain and some CP.  Also mild worsening SOB.  Today given persistent elevated HR came here for EMS.  Daughter with pt and gives most of hx. Pt is in no acute distress but rales bilaterally  and irregular tachycardia.  Pt started on cardizem with gtt.  12:58 PM After 1 dose of cardizem HR controlled to <100.  Even with mild exertion pt had recurrent chest and back pain but now gone.  Will discuss with cards.  Gwyneth Sprout, MD 04/06/13 1304

## 2013-04-06 NOTE — H&P (Signed)
CARDIOLOGY ADMISSION NOTE  Patient ID: Jordan Blackwell MRN: [id number] DOB/AGE: [birth year] 80 y.o.  Admit date: 04/06/2013 Primary Physician   Primary MD retired Primary Cardiologist   Dr. Sampson GoonFitzgerald Chief Complaint    Chest pain  HPI:  The patient presents for evaluation of chest pain.  He was found to be in afib with RVR.  He has been treated in the ER with IV Cardizem.  He was treated with NTG in a SL x 3.    INR  Was subtherapeutic. His pro BNP was slightly elevated at 2992.for his point-of-care enzyme was negative. The patient has a history of coronary artery disease status post CABG. He's also had mitral valve repair or, atrial fibrillation with chronic anticoagulation and a previous CVA during a complicated hospitalization following hernia repair .  He reports that he's been getting progressively more short of breath over the last several weeks. He is dyspneic walking a short distance on level ground. He's not describing PND or orthopnea. He's not had fevers or chills though he has had some cough with mucus production. A couple of days ago he started having some right posterior neck discomfort followed by left posterior neck discomfort which this morning was radiating down into his chest. He felt uncomfortable and couldn't sleep through the night. He had been instructed to be taking his blood pressure which he did through the morning hours and was noticing his heart rate to be consistently in the 140s. He presented to urgent care and was referred here. Of note he recently stopped his beta blocker as he been having some bradycardia. He saw his cardiologist was set up to get a Holter and an echocardiogram.   Past Medical History  Diagnosis Date  . Coronary artery disease   . Stroke   . Atrial fibrillation   . Atrial thrombus   . Hypertension   . B12 deficiency   . Folic acid deficiency     Past Surgical History  Procedure Laterality Date  . Inguinal hernia repair    . Coronary  artery bypass graft      1993  . Mitral valve anuloplasty    . Cholecystectomy    . Ankle fracture surgery    . Appendectomy    . Abdominal adhesion surgery      No Known Allergies No current facility-administered medications on file prior to encounter.   Current Outpatient Prescriptions on File Prior to Encounter  Medication Sig Dispense Refill  . dabigatran (PRADAXA) 150 MG CAPS Take 150 mg by mouth every 12 (twelve) hours.        Marland Kitchen. diltiazem (TIAZAC) 180 MG 24 hr capsule Take 180 mg by mouth daily.        Marland Kitchen. lisinopril (PRINIVIL,ZESTRIL) 2.5 MG tablet Take 2.5 mg by mouth daily.        . Multiple Vitamin (MULTIVITAMIN) tablet Take 1 tablet by mouth daily.        . pantoprazole (PROTONIX) 40 MG tablet Take 40 mg by mouth daily.         History   Social History  . Marital Status: Widowed    Spouse Name: N/A    Number of Children: N/A  . Years of Education: N/A   Occupational History  . Not on file.   Social History Main Topics  . Smoking status: Former Games developermoker  . Smokeless tobacco: Never Used  . Alcohol Use: No  . Drug Use: No  . Sexual Activity: No   Other Topics  Concern  . Not on file   Social History Narrative  . No narrative on file    History reviewed. No pertinent family history.   ROS:  As stated in the HPI and negative for all other systems.  Physical Exam: Blood pressure 110/56, pulse 94, temperature 98.2 F (36.8 C), temperature source Oral, resp. rate 21, SpO2 97.00%.  GENERAL:  Well appearing HEENT:  Pupils equal round and reactive, fundi not visualized, oral mucosa unremarkable NECK:  No jugular venous distention, waveform within normal limits, carotid upstroke brisk and symmetric, no bruits, no thyromegaly LYMPHATICS:  No cervical, inguinal adenopathy LUNGS:  Bilateral basilar crackles one third of the way up BACK:  No CVA tenderness CHEST:  Well healed sternotomy scar. HEART:  PMI not displaced or sustained,S1 and S2 within normal limits, no S3,  no clicks, no rubs, systolic murmur at the apex, no diastolic murmurs ABD:  Flat, positive bowel sounds normal in frequency in pitch, no bruits, no rebound, no guarding, no midline pulsatile mass, no hepatomegaly, no splenomegaly EXT:  2 plus pulses throughout, trace right ankle edema, no cyanosis no clubbing SKIN:  No rashes no nodules NEURO:  Cranial nerves II through XII grossly intact, motor grossly intact throughout PSYCH:  Cognitively intact, oriented to person place and time  Labs: Lab Results  Component Value Date   BUN 15 04/06/2013   Lab Results  Component Value Date   CREATININE 0.89 04/06/2013   Lab Results  Component Value Date   NA 136* 04/06/2013   K 4.2 04/06/2013   CL 97 04/06/2013   CO2 26 04/06/2013   Lab Results  Component Value Date   TROPONINI <0.30 03/08/2011   Lab Results  Component Value Date   WBC 10.7* 04/06/2013   HGB 14.9 04/06/2013   HCT 43.0 04/06/2013   MCV 92.5 04/06/2013   PLT 196 04/06/2013   No results found for this basename: CHOL, HDL, LDLCALC, LDLDIRECT, TRIG, CHOLHDL   Lab Results  Component Value Date   ALT 17 04/06/2013   AST 31 04/06/2013   ALKPHOS 75 04/06/2013   BILITOT 1.0 04/06/2013      Radiology:  CXR:   interstitial lung densities with slightly increased  densities in the left mid and lower lung region. This could  represent acute on chronic disease at this location. Overall, there  has been minimal change since 03/09/2011.  EKG:  Atrial fib flutter with rate 149.  RBBB.  04/06/2013  ASSESSMENT AND PLAN:    ATRIAL FIB:  I suspect any acute problems are related to rapid rate and having him off his beta blockers. The Cardizem has been stopped as his rate is now controlled. He may be able to tolerate a lower dose of beta blocker but I will leave him off this for now. I will have further discussion with him about the use of NOAC in this situation as he does have a history of mitral valve disease status post repair.  CHF:  He does seem to have  crackles on exam and I will give him at least 1 dose of IV Lasix to begin with and then check an echocardiogram. He may have acute on chronic diastolic dysfunction related to his A. fib with rapid rate.  CAD:  He does have some chest discomfort. I will cycle cardiac enzymes. If these are negative I might suggest outpatient pharmacologic stress testing. If positive we would need cardiac catheterization.   SignedRollene Rotunda 04/06/2013, 1:47 PM

## 2013-04-06 NOTE — Progress Notes (Addendum)
Notified of patients HR of 156; checked on patient; patient was just finished having a bowel movement; PA notified; PA said to monitor HR if HR stays above 120 call back; patient HR is currently high 90s-110s; will continue to monitor.  [name], Geroge BasemanHEATHER M

## 2013-04-07 LAB — BASIC METABOLIC PANEL
BUN: 19 mg/dL (ref 6–23)
CALCIUM: 9.2 mg/dL (ref 8.4–10.5)
CHLORIDE: 95 meq/L — AB (ref 96–112)
CO2: 30 mEq/L (ref 19–32)
CREATININE: 1.22 mg/dL (ref 0.50–1.35)
GFR calc Af Amer: 62 mL/min — ABNORMAL LOW (ref >90)
GFR calc non Af Amer: 54 mL/min — ABNORMAL LOW (ref >90)
GLUCOSE: 112 mg/dL — AB (ref 70–99)
Potassium: 4.3 mEq/L (ref 3.7–5.3)
Sodium: 135 mEq/L — ABNORMAL LOW (ref 137–147)

## 2013-04-07 LAB — TSH: TSH: 2.406 u[IU]/mL (ref 0.350–4.500)

## 2013-04-07 LAB — TROPONIN I: Troponin I: <0.3 ng/mL (ref <0.30)

## 2013-04-07 MED ORDER — METOPROLOL TARTRATE 25 MG PO TABS
25.0000 mg | ORAL_TABLET | Freq: Three times a day (TID) | ORAL | Status: DC
  Administered 2013-04-07 – 2013-04-08 (×2): 25 mg via ORAL
  Filled 2013-04-07 (×6): qty 1

## 2013-04-07 MED ORDER — FUROSEMIDE 10 MG/ML IJ SOLN
INTRAMUSCULAR | Status: AC
  Filled 2013-04-07: qty 4

## 2013-04-07 MED ORDER — OFF THE BEAT BOOK
Freq: Once | Status: AC
  Administered 2013-04-07: 22:00:00
  Filled 2013-04-07: qty 1

## 2013-04-07 MED ORDER — FUROSEMIDE 10 MG/ML IJ SOLN
40.0000 mg | Freq: Once | INTRAMUSCULAR | Status: AC
  Administered 2013-04-07: 40 mg via INTRAVENOUS

## 2013-04-07 NOTE — Progress Notes (Signed)
Echocardiogram 2D Echocardiogram has been performed.  Jordan Blackwell, Jordan Blackwell 04/07/2013, 3:25 PM

## 2013-04-07 NOTE — Progress Notes (Signed)
Patient ambulated hallway [id number]; while ambulating patient became SOB and dizzy; checked oxygen saturation 85% BP 90/59; 2L of O2 applied oxygen back up to 97%; MD notified; no new orders given; will continue to monitor patient.  [name], Jordan BasemanHEATHER M

## 2013-04-07 NOTE — Progress Notes (Signed)
SUBJECTIVE:  He has had no chest pain.  No acute SOB   PHYSICAL EXAM Filed Vitals:   04/06/13 1554 04/06/13 1601 04/06/13 2045 04/07/13 0521  BP: 114/55  111/53 110/60  Pulse: 90  88 121  Temp: 97.9 F (36.6 C)  98 F (36.7 C) 99.5 F (37.5 C)  TempSrc: Oral  Oral Oral  Resp: 20  18 18   Height:  5\' 7"  (1.702 m)    Weight:  130 lb 4.8 oz (59.104 kg)  128 lb 1.4 oz (58.1 kg)  SpO2: 95%  91% 94%   General:  No acute distress Lungs:  Bilateral basilar crackles Heart:  Irregular Abdomen:  Positive bowel sounds, no rebound no guarding Extremities:  No edema  LABS: Lab Results  Component Value Date   TROPONINI <0.30 04/07/2013   Results for orders placed during the hospital encounter of 04/06/13 (from the past 24 hour(s))  CBC     Status: Abnormal   Collection Time    04/06/13 11:21 AM      Result Value Range   WBC 10.7 (*) 4.0 - 10.5 K/uL   RBC 4.65  4.22 - 5.81 MIL/uL   Hemoglobin 14.9  13.0 - 17.0 g/dL   HCT 29.543.0  62.139.0 - 30.852.0 %   MCV 92.5  78.0 - 100.0 fL   MCH 32.0  26.0 - 34.0 pg   MCHC 34.7  30.0 - 36.0 g/dL   RDW 65.713.0  84.611.5 - 96.215.5 %   Platelets 196  150 - 400 K/uL  PRO B NATRIURETIC PEPTIDE     Status: Abnormal   Collection Time    04/06/13 11:27 AM      Result Value Range   Pro B Natriuretic peptide (BNP) 2992.0 (*) 0 - 450 pg/mL  COMPREHENSIVE METABOLIC PANEL     Status: Abnormal   Collection Time    04/06/13 11:27 AM      Result Value Range   Sodium 136 (*) 137 - 147 mEq/L   Potassium 4.2  3.7 - 5.3 mEq/L   Chloride 97  96 - 112 mEq/L   CO2 26  19 - 32 mEq/L   Glucose, Bld 112 (*) 70 - 99 mg/dL   BUN 15  6 - 23 mg/dL   Creatinine, Ser 9.520.89  0.50 - 1.35 mg/dL   Calcium 9.9  8.4 - 84.110.5 mg/dL   Total Protein 7.8  6.0 - 8.3 g/dL   Albumin 3.7  3.5 - 5.2 g/dL   AST 31  0 - 37 U/L   ALT 17  0 - 53 U/L   Alkaline Phosphatase 75  39 - 117 U/L   Total Bilirubin 1.0  0.3 - 1.2 mg/dL   GFR calc non Af Amer 78 (*) >90 mL/min   GFR calc Af Amer >90  >90  mL/min  POCT I-STAT TROPONIN I     Status: None   Collection Time    04/06/13 11:30 AM      Result Value Range   Troponin i, poc 0.01  0.00 - 0.08 ng/mL   Comment 3           PROTIME-INR     Status: Abnormal   Collection Time    04/06/13 12:30 PM      Result Value Range   Prothrombin Time 21.1 (*) 11.6 - 15.2 seconds   INR 1.89 (*) 0.00 - 1.49  APTT     Status: Abnormal   Collection Time    04/06/13  12:30 PM      Result Value Range   aPTT 68 (*) 24 - 37 seconds  TSH     Status: None   Collection Time    04/06/13  3:12 PM      Result Value Range   TSH 2.406  0.350 - 4.500 uIU/mL  TROPONIN I     Status: None   Collection Time    04/06/13  3:12 PM      Result Value Range   Troponin I <0.30  <0.30 ng/mL  TROPONIN I     Status: None   Collection Time    04/06/13  8:10 PM      Result Value Range   Troponin I <0.30  <0.30 ng/mL  TROPONIN I     Status: None   Collection Time    04/07/13  2:28 AM      Result Value Range   Troponin I <0.30  <0.30 ng/mL  BASIC METABOLIC PANEL     Status: Abnormal   Collection Time    04/07/13  2:28 AM      Result Value Range   Sodium 135 (*) 137 - 147 mEq/L   Potassium 4.3  3.7 - 5.3 mEq/L   Chloride 95 (*) 96 - 112 mEq/L   CO2 30  19 - 32 mEq/L   Glucose, Bld 112 (*) 70 - 99 mg/dL   BUN 19  6 - 23 mg/dL   Creatinine, Ser 1.61  0.50 - 1.35 mg/dL   Calcium 9.2  8.4 - 09.6 mg/dL   GFR calc non Af Amer 54 (*) >90 mL/min   GFR calc Af Amer 62 (*) >90 mL/min    Intake/Output Summary (Last 24 hours) at 04/07/13 1116 Last data filed at 04/06/13 1823  Gross per 24 hour  Intake      3 ml  Output    750 ml  Net   -747 ml    ASSESSMENT AND PLAN:  ATRIAL FIB:  I will restart a lower dose of beta blocker today.   CHF:  Echo is pending.   I will give one dose of IV Lasix again today.    CAD:  Enzymes negative x 3.  OK for outpatient stress testing.     Fayrene Fearing [facility] 04/07/2013 11:16 AM

## 2013-04-07 NOTE — Discharge Instructions (Addendum)

## 2013-04-08 DIAGNOSIS — I5031 Acute diastolic (congestive) heart failure: Secondary | ICD-10-CM | POA: Diagnosis present

## 2013-04-08 LAB — BASIC METABOLIC PANEL
BUN: 35 mg/dL — AB (ref 6–23)
CALCIUM: 9.2 mg/dL (ref 8.4–10.5)
CO2: 31 meq/L (ref 19–32)
Chloride: 94 mEq/L — ABNORMAL LOW (ref 96–112)
Creatinine, Ser: 1.51 mg/dL — ABNORMAL HIGH (ref 0.50–1.35)
GFR calc Af Amer: 48 mL/min — ABNORMAL LOW (ref >90)
GFR, EST NON AFRICAN AMERICAN: 42 mL/min — AB (ref >90)
Glucose, Bld: 90 mg/dL (ref 70–99)
Potassium: 4.2 mEq/L (ref 3.7–5.3)
Sodium: 135 mEq/L — ABNORMAL LOW (ref 137–147)

## 2013-04-08 MED ORDER — ENOXAPARIN SODIUM 60 MG/0.6ML ~~LOC~~ SOLN
1.0000 mg/kg | Freq: Two times a day (BID) | SUBCUTANEOUS | Status: DC
  Administered 2013-04-08 – 2013-04-11 (×7): 60 mg via SUBCUTANEOUS
  Filled 2013-04-08 (×9): qty 0.6

## 2013-04-08 MED ORDER — WARFARIN - PHARMACIST DOSING INPATIENT: Freq: Every day | Status: DC

## 2013-04-08 MED ORDER — METOPROLOL TARTRATE 25 MG PO TABS
25.0000 mg | ORAL_TABLET | Freq: Two times a day (BID) | ORAL | Status: DC
  Filled 2013-04-08 (×3): qty 1

## 2013-04-08 MED ORDER — WARFARIN SODIUM 5 MG PO TABS
5.0000 mg | ORAL_TABLET | Freq: Once | ORAL | Status: AC
  Administered 2013-04-08: 5 mg via ORAL
  Filled 2013-04-08: qty 1

## 2013-04-08 NOTE — Progress Notes (Signed)
Utilization Review Completed.Jordan Blackwell T1/08/2013  

## 2013-04-08 NOTE — Progress Notes (Signed)
Nutrition Brief Note  Patient identified on the Malnutrition Screening Tool (MST) Report for recent weight lost without trying and eating poorly because of a decreased appetite.  Per readings, below no recent weight loss this year.  Wt Readings from Last 15 Encounters:  04/08/13 128 lb 1.4 oz (58.1 kg)  03/12/11 136 lb 11 oz (62 kg)    Body mass index is 20.06 kg/(m^2). Patient meets criteria for Normal based on current BMI.   Current diet order is Heart Healthy, patient's average consumption of meals is 75% at this time. Labs and medications reviewed.   No nutrition interventions warranted at this time. If nutrition issues arise, please consult RD.   Maureen ChattersKatie Rhylie Stehr, RD, LDN Pager #: [phone] After-Hours Pager #: [phone]

## 2013-04-08 NOTE — Progress Notes (Signed)
ANTICOAGULATION CONSULT NOTE - Initial Consult  Pharmacy Consult for Coumadin, Lovenox Indication: Afib and MVR  No Known Allergies  Patient Measurements: Height: 5\' 7"  (170.2 cm) Weight: 128 lb 1.4 oz (58.1 kg) IBW/kg (Calculated) : 66.1 Heparin Dosing Weight: n/a  Vital Signs: Temp: 98 F (36.7 C) (01/05 0418) Temp src: Oral (01/05 0418) BP: 115/74 mmHg (01/05 1004) Pulse Rate: 77 (01/05 0418)  Labs:  Recent Labs  04/06/13 1121 04/06/13 1127 04/06/13 1230 04/06/13 1512 04/06/13 2010 04/07/13 0228 04/08/13 0530  HGB 14.9  --   --   --   --   --   --   HCT 43.0  --   --   --   --   --   --   PLT 196  --   --   --   --   --   --   APTT  --   --  68*  --   --   --   --   LABPROT  --   --  21.1*  --   --   --   --   INR  --   --  1.89*  --   --   --   --   CREATININE  --  0.89  --   --   --  1.22 1.51*  TROPONINI  --   --   --  <0.30 <0.30 <0.30  --     Estimated Creatinine Clearance: 31.5 ml/min (by C-G formula based on Cr of 1.51).   Medical History: Past Medical History  Diagnosis Date  . Coronary artery disease   . Stroke   . Atrial fibrillation   . Atrial thrombus   . Hypertension   . B12 deficiency   . Folic acid deficiency   . Mitral valve regurgitation     Medications:  Prescriptions prior to admission  Medication Sig Dispense Refill  . dabigatran (PRADAXA) 150 MG CAPS Take 150 mg by mouth every 12 (twelve) hours.        Marland Kitchen. diltiazem (TIAZAC) 180 MG 24 hr capsule Take 180 mg by mouth daily.        Marland Kitchen. lisinopril (PRINIVIL,ZESTRIL) 2.5 MG tablet Take 2.5 mg by mouth daily.        . Multiple Vitamin (MULTIVITAMIN) tablet Take 1 tablet by mouth daily.        . pantoprazole (PROTONIX) 40 MG tablet Take 40 mg by mouth daily.          Assessment: [age] YOM currently on pradaxa for Afib and mechanical mitral valve replacement. Now stopping pradaxa and starting coumadin with lovenox bridge therapy. CBC wnl. CrCl ~ 31.5 mL/min.   Goal of Therapy:  INR  2-3 Monitor platelets by anticoagulation protocol: Yes   Plan:  1. Lovenox 60 mg subQ BID. First dose 12 hours post Pradaxa 2. Warfarin 5 mg x 1 dose tonight  3. F/u daily INR, CBC and s/s of bleeding    Vinnie LevelBenjamin Dorlene Footman, PharmD.  Clinical Pharmacist Pager [phone]

## 2013-04-08 NOTE — Progress Notes (Addendum)
Patient Name: Jordan Blackwell Date of Encounter: 04/08/2013   Principal Problem:   Atrial fibrillation Active Problems:   Acute diastolic CHF (congestive heart failure)   CAD (coronary artery disease)   HTN (hypertension)   Right heart failure   S/P MVR (mitral valve repair)    SUBJECTIVE  Breathing improved.  Ambulated last night and was noted to have rates in the 60's to 70's with > 2 second pauses.  He was relatively hypotensive with pressures in the 90's and was also lightheaded.  Overnight, he has had intermittent pauses in the 2-2.71 seconds range.  Currently w/o complaints.  CURRENT MEDS . dabigatran  150 mg Oral Q12H  . diltiazem  180 mg Oral Daily  . lisinopril  2.5 mg Oral Daily  . metoprolol tartrate  25 mg Oral Q8H  . pantoprazole  40 mg Oral Daily  . sodium chloride  3 mL Intravenous Q12H   OBJECTIVE  Filed Vitals:   04/07/13 1846 04/07/13 1939 04/07/13 2123 04/08/13 0418  BP: 90/59 93/46  100/59  Pulse: 72 75 61 77  Temp:  98.5 F (36.9 C)  98 F (36.7 C)  TempSrc:  Oral  Oral  Resp:  18  18  Height:      Weight:    128 lb 1.4 oz (58.1 kg)  SpO2: 97% 99%  99%    Intake/Output Summary (Last 24 hours) at 04/08/13 0635 Last data filed at 04/07/13 1852  Gross per 24 hour  Intake    480 ml  Output    500 ml  Net    -20 ml   Filed Weights   04/06/13 1601 04/07/13 0521 04/08/13 0418  Weight: 130 lb 4.8 oz (59.104 kg) 128 lb 1.4 oz (58.1 kg) 128 lb 1.4 oz (58.1 kg)    PHYSICAL EXAM  General: Pleasant, NAD. Neuro: Alert and oriented X 3. Moves all extremities spontaneously. Psych: Normal affect. HEENT:  Normal  Neck: Supple without bruits or JVD. Lungs:  Resp regular and unlabored, bibasilar crackles. Heart: IR IR no s3, s4, 2/6 diast murmur @ [facility]. Abdomen: Soft, non-tender, non-distended, BS + x 4.  Extremities: No clubbing, cyanosis or edema. DP/PT/Radials 2+ and equal bilaterally.  Accessory Clinical Findings  CBC  Recent Labs   04/06/13 1121  WBC 10.7*  HGB 14.9  HCT 43.0  MCV 92.5  PLT 196   Basic Metabolic Panel  Recent Labs  04/07/13 0228 04/08/13 0530  NA 135* 135*  K 4.3 4.2  CL 95* 94*  CO2 30 31  GLUCOSE 112* 90  BUN 19 35*  CREATININE 1.22 1.51*  CALCIUM 9.2 9.2   Liver Function Tests  Recent Labs  04/06/13 1127  AST 31  ALT 17  ALKPHOS 75  BILITOT 1.0  PROT 7.8  ALBUMIN 3.7   Cardiac Enzymes  Recent Labs  04/06/13 1512 04/06/13 2010 04/07/13 0228  TROPONINI <0.30 <0.30 <0.30   Thyroid Function Tests  Recent Labs  04/06/13 1512  TSH 2.406    TELE  Afib, pvc's/couplets, run of NSVT yesterday am.  Longest pause 2.71 seconds.  ECG  none  Radiology/Studies  Dg Chest Port 1 View  04/06/2013   CLINICAL DATA:  Shortness of breath for several months.  EXAM: PORTABLE CHEST - 1 VIEW  COMPARISON:  03/09/2011  FINDINGS: Again noted are diffuse interstitial lung densities that appear chronic. There is slightly increased parenchymal disease in the left lung which could represent an acute process. Heart size is grossly  stable with median sternotomy wires. No evidence for a pneumothorax.  IMPRESSION: Chronic interstitial lung densities with slightly increased densities in the left mid and lower lung region. This could represent acute on chronic disease at this location. Overall, there has been minimal change since 03/09/2011.   Electronically Signed   By: Richarda Overlie M.D.   On: 04/06/2013 12:10   2D Echocardiogram 1.4.2015  Study Conclusions  - Left ventricle: The cavity size was normal. Systolic   function was normal. The estimated ejection fraction was   in the range of 50% to 55%. Wall motion was normal; there   were no regional wall motion abnormalities. - Ventricular septum: Septal motion showed paradox. These   changes are consistent with RV volume and pressure   overload. - Aortic valve: Mild regurgitation. - Left atrium: The atrium was severely dilated. - Right  ventricle: The cavity size was moderately to   severely dilated. Wall thickness was normal. - Right atrium: The atrium was severely dilated. - Tricuspid valve: Moderate regurgitation. - Pulmonary arteries: Systolic pressure was mildly   increased. PA peak pressure: 34mm Hg (S). _____________    ASSESSMENT AND PLAN  1.  Afib/tachy-brady syndrome:  Pt presented w/ progressive dyspnea in the setting of afib rvr.  His home dose of bb had been discontinued by his previous primary cardiologist 2/2 bradycardia and he had been wearing a holter.  Since being resumed on low dose bb, along with po long-acting dilt, he has been having pauses as long as 2.71 seconds.  He was symptomatic and relatively symptomatic last night with ambulation.  He has been told in the past that he may require permanent pacing.  Will discuss with Dr. Jens Som, but it seems that an EP eval will be necessary.  Of note, he is on pradaxa.  He does have a h/o MV dzs and is s/p MVR.  We will have to consider appropriateness of this therapy.  2.  Acute diastolic CHF:  In setting of rapid afib.  Nl LVEF.  RV is mod-sev dil (was mildly to mod dil in 2012).  Volume looks good.  Crackles on exam are chronic per pt and CXR showed chronic interstitial lung densities.  Cont HR/BP control.  3.  HTN:  Stable to hypotensive.  Follow.  4. CAD:  Had c/p on admission.  No further chest pain.  CE neg.  Plan outpt mv.  Pt would like to begin f/u in our [place] office.  5.  Acute renal failure:  BUN/Creat up this AM.  Most likely represents prerenal azotemia in setting of diuresis yesterday.  Volume looks good.  No further diuresis.  May have to give some fluid back if pressures soft today.  Signed, Nicolasa Ducking NP As above, patient seen and examined. He feels somewhat better but remains weak. He has had chest pain that increases with certain movements. 1 atrial fibrillation-the patient has permanent atrial fibrillation. He was on Cardizem 180 mg  daily and Lopressor 75 mg by mouth twice a day previously. He was having bradycardia and his metoprolol was discontinued. He was admitted with tachycardia and fatigue. Cardizem has been continued and metoprolol was resumed at 25 twice a day. He had some bradycardia last evening. Change metoprolol to 12.5 mg by mouth twice a day and follow. Hopefully we can avoid a pacemaker. He has multiple embolic risk factors and a history of valve surgery. NOAC not recommended in this setting. He is agreeable to Coumadin. Discontinue pradaxa and begin  Coumadin/Lovenox. His INR will need to be therapeutic prior to discontinuing Lovenox as he has a history of CVA. Hold further diuresis. He is noted to have crackles on examination but they sound to be dry crackles and possibly related to pulmonary fibrosis. He will ultimately require high-resolution CT and if pulmonary fibrosis demonstrated he will need followup with pulmonary as an outpatient. He is noted to have significant right atrial and right ventricular enlargement possibly from pulmonary fibrosis and prior pulmonary venous hypertension related to valve disease. Discontinue lisinopril to make room for blood pressure with metoprolol. Patient will need an outpatient nuclear study following discharge. Olga MillersBrian Crenshaw

## 2013-04-09 ENCOUNTER — Inpatient Hospital Stay (HOSPITAL_COMMUNITY): Payer: Medicare Other

## 2013-04-09 LAB — BASIC METABOLIC PANEL
BUN: 34 mg/dL — AB (ref 6–23)
CHLORIDE: 93 meq/L — AB (ref 96–112)
CO2: 31 mEq/L (ref 19–32)
CREATININE: 1.28 mg/dL (ref 0.50–1.35)
Calcium: 8.9 mg/dL (ref 8.4–10.5)
GFR, EST AFRICAN AMERICAN: 59 mL/min — AB (ref >90)
GFR, EST NON AFRICAN AMERICAN: 51 mL/min — AB (ref >90)
Glucose, Bld: 95 mg/dL (ref 70–99)
POTASSIUM: 5 meq/L (ref 3.7–5.3)
Sodium: 134 mEq/L — ABNORMAL LOW (ref 137–147)

## 2013-04-09 LAB — PROTIME-INR
INR: 1.39 (ref 0.00–1.49)
Prothrombin Time: 16.7 seconds — ABNORMAL HIGH (ref 11.6–15.2)

## 2013-04-09 MED ORDER — METOPROLOL TARTRATE 12.5 MG HALF TABLET
12.5000 mg | ORAL_TABLET | Freq: Two times a day (BID) | ORAL | Status: DC
  Administered 2013-04-09 – 2013-04-10 (×4): 12.5 mg via ORAL
  Filled 2013-04-09 (×6): qty 1

## 2013-04-09 MED ORDER — WARFARIN SODIUM 5 MG PO TABS
5.0000 mg | ORAL_TABLET | Freq: Once | ORAL | Status: AC
  Administered 2013-04-09: 5 mg via ORAL
  Filled 2013-04-09: qty 1

## 2013-04-09 NOTE — Progress Notes (Signed)
ANTICOAGULATION CONSULT NOTE - Follow up Consult  Pharmacy Consult for Coumadin, Lovenox Indication: Afib and MVR  No Known Allergies  Patient Measurements: Height: 5\' 7"  (170.2 cm) Weight: 127 lb 6.8 oz (57.8 kg) IBW/kg (Calculated) : 66.1 Heparin Dosing Weight: n/a  Vital Signs: Temp: 97.5 F (36.4 C) (01/06 0511) Temp src: Oral (01/06 0511) BP: 99/61 mmHg (01/06 0511) Pulse Rate: 79 (01/06 0511)  Labs:  Recent Labs  04/06/13 1121  04/06/13 1230 04/06/13 1512 04/06/13 2010 04/07/13 0228 04/08/13 0530 04/09/13 0331  HGB 14.9  --   --   --   --   --   --   --   HCT 43.0  --   --   --   --   --   --   --   PLT 196  --   --   --   --   --   --   --   APTT  --   --  68*  --   --   --   --   --   LABPROT  --   --  21.1*  --   --   --   --  16.7*  INR  --   --  1.89*  --   --   --   --  1.39  CREATININE  --   < >  --   --   --  1.22 1.51* 1.28  TROPONINI  --   --   --  <0.30 <0.30 <0.30  --   --   < > = values in this interval not displayed.  Estimated Creatinine Clearance: 37 ml/min (by C-G formula based on Cr of 1.28).   Assessment: [age] YOM currently on pradaxa for Afib and mechanical mitral valve replacement. Now stopping pradaxa and starting coumadin with lovenox bridge therapy. CBC wnl. CrCl improved to 37 mL/min. INR trended down to 1.39 likely due to clearance of Pradaxa with improving renal fx. Current INR likely closer to true value.   Goal of Therapy:  INR 2-3 Monitor platelets by anticoagulation protocol: Yes   Plan:  1. Lovenox 60 mg subQ BID till INR > 2  2. Repeat warfarin 5 mg x 1 dose tonight  3. F/u daily INR, CBC and s/s of bleeding    Jordan Blackwell, PharmD.  Clinical Pharmacist Pager [phone]

## 2013-04-09 NOTE — Progress Notes (Signed)
Patient Name: Jordan Blackwell Date of Encounter: 04/09/2013   Principal Problem:   Atrial fibrillation Active Problems:   CAD (coronary artery disease)   HTN (hypertension)   S/P MVR (mitral valve repair)   Acute diastolic CHF (congestive heart failure)   Right heart failure    SUBJECTIVE  Denies dyspnea or chest pain; mild dizziness with ambulation yesterday.   CURRENT MEDS . diltiazem  180 mg Oral Daily  . enoxaparin (LOVENOX) injection  1 mg/kg Subcutaneous BID  . metoprolol tartrate  25 mg Oral BID  . pantoprazole  40 mg Oral Daily  . sodium chloride  3 mL Intravenous Q12H  . Warfarin - Pharmacist Dosing Inpatient   Does not apply q1800   OBJECTIVE  Filed Vitals:   04/08/13 1004 04/08/13 1300 04/08/13 2125 04/09/13 0511  BP: 115/74 104/65 116/74 99/61  Pulse:  78 81 79  Temp:  97.9 F (36.6 C) 97.6 F (36.4 C) 97.5 F (36.4 C)  TempSrc:  Oral Oral Oral  Resp:  20 18 20   Height:      Weight:    127 lb 6.8 oz (57.8 kg)  SpO2:  91% 94% 92%    Intake/Output Summary (Last 24 hours) at 04/09/13 0758 Last data filed at 04/09/13 0513  Gross per 24 hour  Intake    720 ml  Output   1700 ml  Net   -980 ml   Filed Weights   04/07/13 0521 04/08/13 0418 04/09/13 0511  Weight: 128 lb 1.4 oz (58.1 kg) 128 lb 1.4 oz (58.1 kg) 127 lb 6.8 oz (57.8 kg)    PHYSICAL EXAM  General: Pleasant, NAD. Neuro: Alert and oriented X 3. Moves all extremities spontaneously. HEENT:  Normal  Neck: Supple  Lungs:  Bibasilar dry crackles. Heart: Irregular Abdomen: Soft, non-tender, non-distended Extremities: No edema.   Accessory Clinical Findings  CBC  Recent Labs  04/06/13 1121  WBC 10.7*  HGB 14.9  HCT 43.0  MCV 92.5  PLT 196   Basic Metabolic Panel  Recent Labs  04/08/13 0530 04/09/13 0331  NA 135* 134*  K 4.2 5.0  CL 94* 93*  CO2 31 31  GLUCOSE 90 95  BUN 35* 34*  CREATININE 1.51* 1.28  CALCIUM 9.2 8.9   Liver Function Tests  Recent Labs   04/06/13 1127  AST 31  ALT 17  ALKPHOS 75  BILITOT 1.0  PROT 7.8  ALBUMIN 3.7   Cardiac Enzymes  Recent Labs  04/06/13 1512 04/06/13 2010 04/07/13 0228  TROPONINI <0.30 <0.30 <0.30   Thyroid Function Tests  Recent Labs  04/06/13 1512  TSH 2.406    TELE  Afib, transient bradycardia last PM and lopressor held   Radiology/Studies  Dg Chest Port 1 View  04/06/2013   CLINICAL DATA:  Shortness of breath for several months.  EXAM: PORTABLE CHEST - 1 VIEW  COMPARISON:  03/09/2011  FINDINGS: Again noted are diffuse interstitial lung densities that appear chronic. There is slightly increased parenchymal disease in the left lung which could represent an acute process. Heart size is grossly stable with median sternotomy wires. No evidence for a pneumothorax.  IMPRESSION: Chronic interstitial lung densities with slightly increased densities in the left mid and lower lung region. This could represent acute on chronic disease at this location. Overall, there has been minimal change since 03/09/2011.   Electronically Signed   By: Richarda Overlie M.D.   On: 04/06/2013 12:10   2D Echocardiogram 1.4.2015  Study Conclusions  -  Left ventricle: The cavity size was normal. Systolic   function was normal. The estimated ejection fraction was   in the range of 50% to 55%. Wall motion was normal; there   were no regional wall motion abnormalities. - Ventricular septum: Septal motion showed paradox. These   changes are consistent with RV volume and pressure   overload. - Aortic valve: Mild regurgitation. - Left atrium: The atrium was severely dilated. - Right ventricle: The cavity size was moderately to   severely dilated. Wall thickness was normal. - Right atrium: The atrium was severely dilated. - Tricuspid valve: Moderate regurgitation. - Pulmonary arteries: Systolic pressure was mildly   increased. PA peak pressure: 34mm Hg (S). _____________    ASSESSMENT AND PLAN  1.  Afib: patient  remains in permanent atrial fibrillation. His rate was mildly decreased last evening and his metoprolol was held. He was receiving 25 mg by mouth twice a day in addition to his Cardizem. I will reduce his metoprolol to 12.5 mg by mouth twice a day and follow telemetry. Hopefully we can avoid pacemaker. His pradaxa was discontinued given history of valvular heart disease. Continue Lovenox and Coumadin. Lovenox will need to be continued until INR is greater than 2. This may be performed at home once we're sure his rate is stable.  2.  Acute diastolic CHF:  In setting of rapid afib.  Nl LVEF.  RV is mod-sev dil (was mildly to mod dil in 2012).  Volume looks good.  Crackles on exam are chronic per pt and CXR showed chronic interstitial lung densities.  Cont HR/BP control. No further diuresis.  3.  HTN:  Borderline; decrease metoprolol.  4. CAD:  Had c/p on admission.  No further chest pain.  CE neg.  Plan outpt mv.   5.  Acute renal failure:  BUN/Creat improving.  Most likely represents prerenal azotemia in setting of diuresis yesterday.  Volume looks good.  No further diuresis.   6. Significant dry crackles on examination. Patient sounds to potentially have ;ulmonary fibrosis. Proceed with high-resolution CT scan. If documented he will need outpatient pulmonary followup.  Signed, Olga MillersBrian Linell Shawn MD

## 2013-04-09 NOTE — Evaluation (Signed)
Physical Therapy Evaluation Patient Details Name: Jordan Blackwell MRN: [id number] DOB: 1929-11-20 Today's Date: 04/09/2013 Time: [id number] PT Time Calculation (min): 16 min  PT Assessment / Plan / Recommendation History of Present Illness  The patient presents for evaluation of chest pain.  He was found to be in afib with RVR.  He has been treated in the ER with IV Cardizem.  He was treated with NTG in a SL x 3.    INR  Was subtherapeutic. His pro BNP was slightly elevated at 2992.for his point-of-care enzyme was negative. The patient has a history of coronary artery disease status post CABG. He's also had mitral valve repair or, atrial fibrillation with chronic anticoagulation and a previous CVA during a complicated hospitalization following hernia repair .  Clinical Impression  Pt demonstrates decreased balance, mobility and endurance during evaluation.  Currently requires min/guard to supervision for ambulation, however feel that he would benefit from further balance challenges while in hospital, as well as with HHPT at D/c.  Pt to speak with daughter regarding HHPT.  Note SaO2 was 85% following ambulation per PTs pulseox, therefore retrieved larger dynamap to confirm and it read 85% as well, however waveform not even.  Replaced on different finger with reading of 94%.  RN notified.      PT Assessment  Patient needs continued PT services    Follow Up Recommendations  Home health PT (if pt and daughter agree)    Does the patient have the potential to tolerate intense rehabilitation      Barriers to Discharge        Equipment Recommendations  None recommended by PT    Recommendations for Other Services     Frequency Min 3X/week    Precautions / Restrictions Precautions Precautions: Fall Precaution Comments: monitor O2 Restrictions Weight Bearing Restrictions: No   Pertinent Vitals/Pain No pain      Mobility  Bed Mobility Bed Mobility: Supine to Sit Supine to Sit: 5:  Supervision Details for Bed Mobility Assistance: Supervision for safety Transfers Transfers: Sit to Stand;Stand to Sit Sit to Stand: 5: Supervision;From bed Stand to Sit: 5: Supervision;To chair/3-in-1 Details for Transfer Assistance: Min cues for hand placement and safety.  Ambulation/Gait Ambulation/Gait Assistance: 4: Min guard;5: Supervision;4: Min assist Ambulation Distance (Feet): 200 Feet Assistive device: None Ambulation/Gait Assistance Details: Pt initially requiring min/guard to min assist due to LOB, however once continued with ambulation, was able to ambulate at close supervision.  Min cues for upright posture and increased stride length.  Gait Pattern: Step-through pattern;Decreased stride length;Narrow base of support Stairs: No    Exercises     PT Diagnosis: Difficulty walking;Generalized weakness  PT Problem List: Decreased strength;Decreased activity tolerance;Decreased balance;Decreased mobility PT Treatment Interventions: Gait training;Stair training;Functional mobility training;Therapeutic activities;Therapeutic exercise;Balance training;Patient/family education     PT Goals(Current goals can be found in the care plan section) Acute Rehab PT Goals Patient Stated Goal: to return home PT Goal Formulation: With patient Time For Goal Achievement: 04/16/13 Potential to Achieve Goals: Good  Visit Information  Last PT Received On: 04/09/13 Assistance Needed: +1 History of Present Illness: The patient presents for evaluation of chest pain.  He was found to be in afib with RVR.  He has been treated in the ER with IV Cardizem.  He was treated with NTG in a SL x 3.    INR  Was subtherapeutic. His pro BNP was slightly elevated at 2992.for his point-of-care enzyme was negative. The patient has a history of  coronary artery disease status post CABG. He's also had mitral valve repair or, atrial fibrillation with chronic anticoagulation and a previous CVA during a complicated  hospitalization following hernia repair .       Prior Functioning  Home Living Family/patient expects to be discharged to:: Private residence Living Arrangements: Alone Available Help at Discharge: Family;Available PRN/intermittently Type of Home: House Home Access: Stairs to enter [employer] of Steps: 3 Entrance Stairs-Rails: None Home Layout: Two level;Able to live on main level with bedroom/bathroom Home Equipment: [place] - single point Prior Function Level of Independence: Independent Communication Communication: No difficulties    Cognition  Cognition Arousal/Alertness: Awake/alert Behavior During Therapy: WFL for tasks assessed/performed Overall Cognitive Status: Within Functional Limits for tasks assessed    Extremity/Trunk Assessment Lower Extremity Assessment Lower Extremity Assessment: Generalized weakness Cervical / Trunk Assessment Cervical / Trunk Assessment: Normal   Balance    End of Session PT - End of Session Activity Tolerance: Patient tolerated treatment well Patient left: in chair;with call bell/phone within reach Nurse Communication: Mobility status  GP     Vista Deck 04/09/2013, 4:24 PM

## 2013-04-09 NOTE — Care Management Note (Addendum)
    Page 1 of 2   04/12/2013     4:20:33 PM   CARE MANAGEMENT NOTE 04/12/2013  Patient:  Jordan Blackwell, Jordan Blackwell   Account Number:  [id number]  Date Initiated:  04/09/2013  Documentation initiated by:  Isahi Godwin  Subjective/Objective Assessment:   PT ADM WITH AFIB, CHF ON 04/06/13.  PTA, PT INDEPENDENT, LIVES ALONE.  HAS A SUPPORTIVE DAUGHTER, WHO IS A NURSE.     Action/Plan:   CM REFERRAL FOR HOME LOVENOX.   Anticipated DC Date:  04/12/2013   Anticipated DC Plan:  [place]  CM consult  Medication Assistance      [facility] Choice  HOME HEALTH   Choice offered to / List presented to:  C-1 Patient   DME arranged  [facility]      DME agency  [place] arranged  HH-1 RN  [place].   Status of service:  Completed, signed off Medicare Important Message given?   (If response is "NO", the following Medicare IM given date fields will be blank) Date Medicare IM given:   Date Additional Medicare IM given:    Discharge Disposition:  [place]  Per UR Regulation:  Reviewed for med. necessity/level of care/duration of stay  If discussed at [place] of Stay Meetings, dates discussed:    Comments:  04/12/13 Jordan Hosking,RN,BSN [phone] PT [place].  START OF CARE FOR HH 24-48H POST DC DATE. PORTABLE TANK DELIVERED TO ROOM PRIOR TO DC.  04/10/13 Jordan Wehrman,RN,BSN [phone] PT FOR DC HOME TOMORROW.  WILL NEED HOME OXYGEN SET UP AT DC, AS DESATS WITH AMBULATION.  P.T. RECOMMENDING HH FOLLOW UP.  PT/DTR PREFER [facility] FOR HH, IF NEEDED.  PT REFERRED TO OP PULMONARY REHAB DEPT, PER PCCM RECOMMENDATION.  THEY WILL MEET WITH PT TO DISCUSS SETTING UP SERVICES UPON DC.   04/09/13 Jordan Melucci,RN,BSN [phone] MET WITH PT AND DAUGHTER TO DISCUSS HOME LOVENOX.  DAUGHTER IS A NURSE, AND CAN GIVE PT HIS INJECTIONS UPON DC.  PER PT'S PHARMACY, [facility]  [address], COST OF GENERIC LOVENOX WILL BE $10 FOR 10 SYRINGES OF $RemoveBef'60MG'[id number]$  DOSE.  NOTIFIED PT/DTR OF THIS.  DAUGHTER REQUESTS P.T. EVALUATION FOR PT, AS SHE STATES HE HAS ISSUES WITH DOE.  P.T. CONSULT ORDERED PER CHF PROTOCOL.

## 2013-04-10 DIAGNOSIS — I5031 Acute diastolic (congestive) heart failure: Secondary | ICD-10-CM

## 2013-04-10 DIAGNOSIS — I509 Heart failure, unspecified: Secondary | ICD-10-CM

## 2013-04-10 DIAGNOSIS — R911 Solitary pulmonary nodule: Secondary | ICD-10-CM

## 2013-04-10 DIAGNOSIS — J84112 Idiopathic pulmonary fibrosis: Secondary | ICD-10-CM

## 2013-04-10 LAB — CBC
HEMATOCRIT: 39.5 % (ref 39.0–52.0)
HEMOGLOBIN: 13.4 g/dL (ref 13.0–17.0)
MCH: 31.5 pg (ref 26.0–34.0)
MCHC: 33.9 g/dL (ref 30.0–36.0)
MCV: 92.9 fL (ref 78.0–100.0)
Platelets: 234 10*3/uL (ref 150–400)
RBC: 4.25 MIL/uL (ref 4.22–5.81)
RDW: 12.7 % (ref 11.5–15.5)
WBC: 7.6 10*3/uL (ref 4.0–10.5)

## 2013-04-10 LAB — BASIC METABOLIC PANEL
BUN: 32 mg/dL — ABNORMAL HIGH (ref 6–23)
CHLORIDE: 96 meq/L (ref 96–112)
CO2: 30 mEq/L (ref 19–32)
CREATININE: 1.12 mg/dL (ref 0.50–1.35)
Calcium: 9.5 mg/dL (ref 8.4–10.5)
GFR calc non Af Amer: 60 mL/min — ABNORMAL LOW (ref >90)
GFR, EST AFRICAN AMERICAN: 69 mL/min — AB (ref >90)
Glucose, Bld: 89 mg/dL (ref 70–99)
POTASSIUM: 4.7 meq/L (ref 3.7–5.3)
Sodium: 135 mEq/L — ABNORMAL LOW (ref 137–147)

## 2013-04-10 LAB — PROTIME-INR
INR: 1.2 (ref 0.00–1.49)
PROTHROMBIN TIME: 14.9 s (ref 11.6–15.2)

## 2013-04-10 MED ORDER — BOOST / RESOURCE BREEZE PO LIQD
1.0000 | Freq: Three times a day (TID) | ORAL | Status: DC
  Administered 2013-04-10 – 2013-04-12 (×3): 1 via ORAL

## 2013-04-10 MED ORDER — WARFARIN SODIUM 5 MG PO TABS
5.0000 mg | ORAL_TABLET | Freq: Once | ORAL | Status: AC
  Administered 2013-04-10: 5 mg via ORAL
  Filled 2013-04-10: qty 1

## 2013-04-10 NOTE — Consult Note (Addendum)
Name: Jordan Blackwell MRN: [id number] DOB: 10/09/1929    ADMISSION DATE:  04/06/2013 CONSULTATION DATE:  04/10/12  REFERRING MD :  Dr. Antoine Poche PRIMARY SERVICE:  Cardiology  CHIEF COMPLAINT:  Pulmonary Nodule, Concern for ILD on CT Chest  BRIEF PATIENT DESCRIPTION: 80 y/o M admitted on 1/3 with chest pain, shortness of breath & found to be in Afib with RVR.  After rate control achieved, pt continued to have ongoing SOB & CT Chest was evaluated with findings of pulmonary nodule & concern for ILD.     SIGNIFICANT EVENTS / STUDIES:  1/3 - admit with chest pain, shortness of breath & found to be in Afib with RVR 1/4 - ECHO:  EF 50-55%, nml wall motion, RV mod-severe dilated, RA severely dilated, PA Peak 34 mm Hg 1/6 - High Res CT: patchy areas of mild GGO, early honeycombing concerning for ILD / UIP, 9x7 pleural-based nodule RUL   HISTORY OF PRESENT ILLNESS:  80 y/o M with PMH of CAD s/p CABG, HTN, MVR on chronic anticoagulation, CVA and afib who presented to [facility] ER on 1/3 with complaints of chest pain and progressive shortness of breath.  He was evaluated and found to be in afib with RVR.  Patient was treated with NTG, lasix & cardizem.  Patient admitted for rate control and further evaluation per Cardiology. ECHO was evaluated which demonstrated an EF 50-55%, nml wall motion, RV mod-severe dilated, RA severely dilated, PA Peak 34 mm Hg.  Cardiac enzymes neg x3.  Hospital course noted mildly soft BP's with lightheadedness and intermittent pauses.  Despite diuresis, he continued to have dry crackles on exam and was evaluated with a high resolution CT of chest which was concerning for ILD / UIP.  PCCM consulted for further evaluation.   At baseline, patient is very active, continues to mow yard, ambulates without assistance, lives alone and manages a 5 br home with yard work.  He has had progressive mild SOB since summer 2014 but specific worsening since Thanksgiving 2014. He noted that he had more  difficulty walking 200 yards than usual.  Denies known illness, fevers,chills, sputum production, cough.  Reports generalized weakness / decreased activity tolerance since summer.  Noted >20 lb weight loss.  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  Wife smoked 3ppd in home & car, died of small cell lung ca Pets: none Medications: never on amiodarone   PAST MEDICAL HISTORY :  Past Medical History  Diagnosis Date  . Coronary artery disease   . Stroke   . Atrial fibrillation   . Atrial thrombus   . Hypertension   . B12 deficiency   . Folic acid deficiency   . Mitral valve regurgitation    Past Surgical History  Procedure Laterality Date  . Inguinal hernia repair    . Coronary artery bypass graft      1993  . Mitral valve anuloplasty      2003 [facility]  . Cholecystectomy    . Ankle fracture surgery    . Appendectomy    . Abdominal adhesion surgery    . Esophageal dilatation    . Cataract extraction    . Mitral valve repair N/A 2003   Prior to Admission medications   Medication Sig Start Date  End Date Taking? Authorizing Provider  dabigatran (PRADAXA) 150 MG CAPS Take 150 mg by mouth every 12 (twelve) hours.     Yes Historical Provider, MD  diltiazem (TIAZAC) 180 MG 24 hr capsule Take 180 mg by mouth daily.     Yes Historical Provider, MD  lisinopril (PRINIVIL,ZESTRIL) 2.5 MG tablet Take 2.5 mg by mouth daily.     Yes Historical Provider, MD  Multiple Vitamin (MULTIVITAMIN) tablet Take 1 tablet by mouth daily.     Yes Historical Provider, MD  pantoprazole (PROTONIX) 40 MG tablet Take 40 mg by mouth daily.     Yes Historical Provider, MD   No Known Allergies  FAMILY HISTORY:  Family History  Problem Relation Age of Onset  . CAD Father   . Alcohol abuse Father   . Breast  cancer Mother   . Hypertension Daughter 54   SOCIAL HISTORY:  reports that he has quit smoking. His smoking use included Cigarettes. He smoked 0.00 packs per day. He has never used smokeless tobacco. He reports that he does not drink alcohol or use illicit drugs.  REVIEW OF SYSTEMS:  See HPI for pertinent positives.  All systems reviewed & otherwise neg.   SUBJECTIVE:   VITAL SIGNS: Temp:  [97 F (36.1 C)-97.4 F (36.3 C)] 97.4 F (36.3 C) (01/07 0453) Pulse Rate:  [78-85] 79 (01/07 0453) Resp:  [19-20] 20 (01/07 0453) BP: (98-114)/(63-70) 108/63 mmHg (01/07 0453) SpO2:  [93 %-96 %] 96 % (01/07 0453) Weight:  [127 lb 10.3 oz (57.9 kg)] 127 lb 10.3 oz (57.9 kg) (01/07 0453)  PHYSICAL EXAMINATION: General:  Elderly male in NAD Neuro:  AAOx4, MAE HEENT:  Mm pink/moist Cardiovascular:  s1s2 irr irr, 3/6 murmur Lungs:  resp's even/non-labored, lungs bilaterally with velcro like crackles 2/3 way up posterior Abdomen:  Round/soft, bsx4 active, bsx4 active Musculoskeletal:  No acute deformities Skin:  Warm/dry, no edema  PULMONARY No results found for this basename: PHART, PCO2, PCO2ART, PO2, PO2ART, HCO3, TCO2, O2SAT,  in the last 168 hours  CBC  Recent Labs Lab 04/06/13 1121 04/10/13 0345  HGB 14.9 13.4  HCT 43.0 39.5  WBC 10.7* 7.6  PLT 196 234    COAGULATION  Recent Labs Lab 04/06/13 1230 04/09/13 0331 04/10/13 0345  INR 1.89* 1.39 1.20    CARDIAC   Recent Labs Lab 04/06/13 1512 04/06/13 2010 04/07/13 0228  TROPONINI <0.30 <0.30 <0.30    Recent Labs Lab 04/06/13 1127  PROBNP 2992.0*     CHEMISTRY  Recent Labs Lab 04/06/13 1127 04/07/13 0228 04/08/13 0530 04/09/13 0331 04/10/13 0345  NA 136* 135* 135* 134* 135*  K 4.2 4.3 4.2 5.0 4.7  CL 97 95* 94* 93* 96  CO2 26 30 31 31 30   GLUCOSE 112* 112* 90 95 89  BUN 15 19 35* 34* 32*  CREATININE 0.89 1.22 1.51* 1.28 1.12  CALCIUM 9.9 9.2 9.2 8.9 9.5   Estimated Creatinine Clearance: 42.4  ml/min (by C-G formula based on Cr of 1.12).   LIVER  Recent Labs Lab 04/06/13 1127 04/06/13 1230 04/09/13 0331 04/10/13 0345  AST 31  --   --   --   ALT 17  --   --   --   ALKPHOS 75  --   --   --   BILITOT 1.0  --   --   --   PROT 7.8  --   --   --   ALBUMIN 3.7  --   --   --  INR  --  1.89* 1.39 1.20     INFECTIOUS No results found for this basename: LATICACIDVEN, PROCALCITON,  in the last 168 hours   ENDOCRINE CBG (last 3)  No results found for this basename: GLUCAP,  in the last 72 hours       IMAGING x48h  Ct Chest High Resolution  04/09/2013   CLINICAL DATA:  Shortness of breath.  Possible pulmonary fibrosis.  EXAM: CHEST CT WITHOUT CONTRAST  TECHNIQUE: Multidetector CT imaging of the chest was performed following the standard protocol without intravenous contrast. High resolution imaging of the lungs, as well as inspiratory and expiratory imaging, was performed.  COMPARISON:  No prior chest CT.  FINDINGS: Mediastinum: Mild to moderate cardiomegaly with right ventricular and right atrial dilatation. Dilatation of the pulmonic trunk (3.7 cm in diameter), suggestive of pulmonary arterial hypertension. There is no significant pericardial fluid, thickening or pericardial calcification. There is atherosclerosis of the thoracic aorta, the great vessels of the mediastinum and the coronary arteries, including calcified atherosclerotic plaque in the left main, left anterior descending, left circumflex and right coronary arteries. Status post median sternotomy for CABG, including LIMA to the LAD. Status post mitral annuloplasty. Multiple borderline enlarged and mildly enlarged mediastinal and hilar lymph nodes, largest of which is in the lower right paratracheal station measuring 1.5 cm in short axis. Several of these lymph nodes have normal appearing fatty hila, and are favored to be reactive. Esophagus is unremarkable in appearance.  Lungs/Pleura: High-resolution images  demonstrates some patchy areas of mild ground-glass attenuation, however, there is extensive peripheral subpleural reticulation and parenchymal banding. Several areas appears suspicious for early honeycombing, most pronounced in the posterior aspects of the lower lobes of the lungs bilaterally. Areas of mild traction bronchiectasis are also noted, most pronounced in the inferior segment of the lingula. All these findings have a definitive craniocaudal gradient. Inspiratory and expiratory imaging is unremarkable. No definite acute consolidative airspace disease. No pleural effusions. 9 x 7 mm pleural-based nodule in the apex of the right upper lobe (image 10 of series 3).  Upper Abdomen: Status post cholecystectomy. Low-attenuation lesions in the upper pole of the right kidney, largest of which measures up to 3.4 cm in diameter; these are incompletely characterized on today's non contrast CT examination, but are favored to represent cysts as they are similar in retrospect compared to prior study from 03/07/2011.  Musculoskeletal: There are no aggressive appearing lytic or blastic lesions noted in the visualized portions of the skeleton. Median sternotomy wires.  IMPRESSION: 1. The appearance of the lung parenchyma is compatible with an underlying interstitial lung disease. Based on the definitive craniocaudal gradient and the presence of areas that appear to represent early honeycombing, this is strongly favored to represent early usual interstitial pneumonia (UIP), and less likely to represent fibrotic phase nonspecific interstitial pneumonia (NSIP). Followup evaluation with repeat high-resolution chest CT would be useful to assess for temporal changes in the lung parenchyma. 2. Today's study does demonstrate a 9 x 7 mm pleural-based nodule in the apex of the right upper lobe (image 10 of series 3), which is nonspecific. There is a good chance that this is simply a nodular focus of chronic pleuroparenchymal  scarring, however, without prior studies to compare with the stability of this nodule is uncertain. Attention to this lesion on followup high-resolution chest CT in 3-6 months is recommended. 3. Atherosclerosis, including left main and 3 vessel coronary artery disease. Status post CABG and mitral annuloplasty. 4. Cardiomegaly with  right-sided cardiac enlargement. In addition, there is dilatation of the pulmonic trunk (3.7 cm in diameter), suggesting pulmonary arterial hypertension. 5. Multiple borderline enlarged and mildly enlarged mediastinal and hilar lymph nodes are favored to be reactive in the setting of chronic interstitial lung disease.   Electronically Signed   By: Trudie Reedaniel  Entrikin M.D.   On: 04/09/2013 11:02      ASSESSMENT / PLAN:   Abnormal CT Chest / ILD   Plan: -assess PFT's as outpatient -follow up in pulmonary office as scheduled -ILD eval per Dr. Conni Elliotamawamy -will need home oxygen arranged -autoimmune labs to be ordered -rate control -add protein supplement, wt loss of >20lbs    Canary BrimBrandi Ollis, NP-C [facility] Pgr: [phone] or [phone]    04/10/2013, 9:53 AM  STAFF NOTE  #ILD Age > 60, Classic UIP Ct, negative physical exam for autoimmune disease, Male sex  = High clinical pre-test prob for IPF. No need for lung biopsy Will send out of autoimmune labs. Agree with home o2 with exertion. Home PT. At fu will dicuss pirfenidone. IPF disease explained as progressive disease to him and daughter with new therapies that can slow down progress.   #Nodule  - needs repeat CT chest in 3 months   #Followup  - Jan 30th, 15.30 with Dr [phone];  Updated daughter [name] who is highly involved and is a Chief Strategy Officerpsych RN in our [facility]     Rest per NP   Dr. Kalman ShanMurali Candence Sease, M.D., [facility].P Pulmonary and Critical Care Medicine Staff Physician [facility] Pager: [phone], If no  answer or between  15:00h - 7:00h: call [phone]  04/10/2013 1:06 PM

## 2013-04-10 NOTE — Progress Notes (Signed)
Patient Name: Jordan Blackwell Date of Encounter: 04/10/2013   Principal Problem:   Atrial fibrillation Active Problems:   CAD (coronary artery disease)   HTN (hypertension)   S/P MVR (mitral valve repair)   Acute diastolic CHF (congestive heart failure)   Right heart failure    SUBJECTIVE  Denies chest pain; some dyspnea with exertion.   CURRENT MEDS . diltiazem  180 mg Oral Daily  . enoxaparin (LOVENOX) injection  1 mg/kg Subcutaneous BID  . metoprolol tartrate  12.5 mg Oral BID  . pantoprazole  40 mg Oral Daily  . sodium chloride  3 mL Intravenous Q12H  . Warfarin - Pharmacist Dosing Inpatient   Does not apply q1800   OBJECTIVE  Filed Vitals:   04/09/13 1259 04/09/13 2004 04/09/13 2120 04/10/13 0453  BP: 98/70 114/65  108/63  Pulse: 78 81 85 79  Temp: 97 F (36.1 C) 97.3 F (36.3 C) 97.4 F (36.3 C) 97.4 F (36.3 C)  TempSrc: Oral Oral Oral Oral  Resp: 19 20 20 20   Height:      Weight:    127 lb 10.3 oz (57.9 kg)  SpO2: 94% 94% 93% 96%    Intake/Output Summary (Last 24 hours) at 04/10/13 0821 Last data filed at 04/09/13 1732  Gross per 24 hour  Intake    460 ml  Output      0 ml  Net    460 ml   Filed Weights   04/08/13 0418 04/09/13 0511 04/10/13 0453  Weight: 128 lb 1.4 oz (58.1 kg) 127 lb 6.8 oz (57.8 kg) 127 lb 10.3 oz (57.9 kg)    PHYSICAL EXAM  General: Pleasant, NAD. Neuro: Alert and oriented X 3. Moves all extremities spontaneously. HEENT:  Normal  Neck: Supple  Lungs:  Bibasilar dry crackles. Heart: Irregular Abdomen: Soft, non-tender, non-distended Extremities: No edema.   Accessory Clinical Findings  CBC  Recent Labs  04/10/13 0345  WBC 7.6  HGB 13.4  HCT 39.5  MCV 92.9  PLT 234   Basic Metabolic Panel  Recent Labs  04/09/13 0331 04/10/13 0345  NA 134* 135*  K 5.0 4.7  CL 93* 96  CO2 31 30  GLUCOSE 95 89  BUN 34* 32*  CREATININE 1.28 1.12  CALCIUM 8.9 9.5    TELE  Afib, rate  controlled.   Radiology/Studies  Dg Chest Port 1 View  04/06/2013   CLINICAL DATA:  Shortness of breath for several months.  EXAM: PORTABLE CHEST - 1 VIEW  COMPARISON:  03/09/2011  FINDINGS: Again noted are diffuse interstitial lung densities that appear chronic. There is slightly increased parenchymal disease in the left lung which could represent an acute process. Heart size is grossly stable with median sternotomy wires. No evidence for a pneumothorax.  IMPRESSION: Chronic interstitial lung densities with slightly increased densities in the left mid and lower lung region. This could represent acute on chronic disease at this location. Overall, there has been minimal change since 03/09/2011.   Electronically Signed   By: Richarda Overlie M.D.   On: 04/06/2013 12:10   2D Echocardiogram 1.4.2015  Study Conclusions  - Left ventricle: The cavity size was normal. Systolic   function was normal. The estimated ejection fraction was   in the range of 50% to 55%. Wall motion was normal; there   were no regional wall motion abnormalities. - Ventricular septum: Septal motion showed paradox. These   changes are consistent with RV volume and pressure   overload. -  Aortic valve: Mild regurgitation. - Left atrium: The atrium was severely dilated. - Right ventricle: The cavity size was moderately to   severely dilated. Wall thickness was normal. - Right atrium: The atrium was severely dilated. - Tricuspid valve: Moderate regurgitation. - Pulmonary arteries: Systolic pressure was mildly   increased. PA peak pressure: 34mm Hg (S). _____________    ASSESSMENT AND PLAN  1.  Afib: patient remains in permanent atrial fibrillation. His rate is now controlled; continue present dose of metoprolol and cardizem. His pradaxa was discontinued given history of valvular heart disease. Continue Lovenox and Coumadin. Lovenox will need to be continued until INR is greater than 2. This may be performed at home once we're  sure his rate is stable.  2.  Acute diastolic CHF:  In setting of rapid afib.  Nl LVEF.  RV is mod-sev dil (was mildly to mod dil in 2012).  Volume looks good.  Cont HR/BP control. No further diuresis.  3.  HTN:  Borderline; controlled.  4. CAD:  Had c/p on admission.  No further chest pain.  CE neg.  Plan outpt mv.   5.  Acute renal failure:  BUN/Creat improving.  Most likely represents prerenal azotemia in setting of diuresis.  Volume looks good.  No further diuresis.   6. ILD: Chest CT shows ILD, most likely UIP; also with pulmonary nodule requiring FU; patient has DOE most likely related to pulmonary disease; will ask pulmonary to review for meds; may need home O2. Tentative DC in AM if HR stable. Signed, Olga MillersBrian Micai Apolinar MD

## 2013-04-10 NOTE — Progress Notes (Signed)
Physical Therapy Treatment Patient Details Name: Jordan Blackwell MRN: [id number] DOB: Jun 17, 1929 Today's Date: 04/10/2013 Time: [id number] PT Time Calculation (min): 25 min  PT Assessment / Plan / Recommendation  History of Present Illness     PT Comments   Pt progressing well with mobility.  Note that he continues to desat (per pulmonary note) with ambulation.  Ambulated 125' on 2L O2 with SaO2 at 94% prior to ambulation then decreased O2 to 1L with SaO2 at 94%.  Note therapist note and pulmonary note with sats dropping to 84-85% on RA with activity.  Feel he will benefit from supplemental O2 at home to address these deficits.  Scored 45/56 on BERG balance test, placing pt one point from goal of 46/56 making pt a moderate fall risk.   Follow Up Recommendations  Home health PT     Does the patient have the potential to tolerate intense rehabilitation     Barriers to Discharge        Equipment Recommendations  None recommended by PT    Recommendations for Other Services    Frequency Min 3X/week   Progress towards PT Goals Progress towards PT goals: Progressing toward goals  Plan Current plan remains appropriate    Precautions / Restrictions Precautions Precautions: Fall Precaution Comments: monitor O2 Restrictions Weight Bearing Restrictions: No   Pertinent Vitals/Pain No pain    Mobility  Bed Mobility Overal bed mobility: Modified Independent General bed mobility comments: Requires increased time and effort to attain sitting position at EOB>  Transfers Overall transfer level: Modified independent Equipment used: None General transfer comment: Again, requires increased time to complete transfers.  Ambulation/Gait Ambulation/Gait assistance: Supervision Ambulation Distance (Feet): 250 Feet Assistive device: None Gait Pattern/deviations: Step-through pattern;Decreased stride length    Exercises     PT Diagnosis:    PT Problem List:   PT Treatment Interventions:      PT Goals (current goals can now be found in the care plan section) Acute Rehab PT Goals PT Goal Formulation: With patient Time For Goal Achievement: 04/16/13 Potential to Achieve Goals: Good  Visit Information  Last PT Received On: 04/10/13 Assistance Needed: +1    Subjective Data      Cognition  Cognition Arousal/Alertness: Awake/alert Behavior During Therapy: WFL for tasks assessed/performed Overall Cognitive Status: Within Functional Limits for tasks assessed    Balance  Balance Overall balance assessment: Modified Independent Standardized Balance Assessment Standardized Balance Assessment : Berg Balance Test Berg Balance Test Sit to Stand: Able to stand  independently using hands Standing Unsupported: Able to stand safely 2 minutes Sitting with Back Unsupported but Feet Supported on Floor or Stool: Able to sit safely and securely 2 minutes Stand to Sit: Sits safely with minimal use of hands Transfers: Able to transfer safely, minor use of hands Standing Unsupported with Eyes Closed: Able to stand 10 seconds safely Standing Ubsupported with Feet Together: Able to place feet together independently and stand 1 minute safely From Standing, Reach Forward with Outstretched Arm: Can reach forward >12 cm safely (5") From Standing Position, Pick up Object from Floor: Able to pick up shoe safely and easily From Standing Position, Turn to Look Behind Over each Shoulder: Looks behind from both sides and weight shifts well Turn 360 Degrees: Able to turn 360 degrees safely but slowly Standing Unsupported, Alternately Place Feet on Step/Stool: Able to complete 4 steps without aid or supervision Standing Unsupported, One Foot in Front: Able to take small step independently and hold 30 seconds  Standing on One Leg: Tries to lift leg/unable to hold 3 seconds but remains standing independently Total Score: 45  End of Session PT - End of Session Equipment Utilized During Treatment:  Oxygen Activity Tolerance: Patient tolerated treatment well Patient left: in bed;with call bell/phone within reach Nurse Communication: Mobility status   GP     Vista Deck 04/10/2013, 2:05 PM

## 2013-04-10 NOTE — Progress Notes (Addendum)
ANTICOAGULATION CONSULT NOTE - Follow up Consult  Pharmacy Consult for Coumadin, Lovenox Indication: Afib and MVR  No Known Allergies  Patient Measurements: Height: 5\' 7"  (170.2 cm) Weight: 127 lb 10.3 oz (57.9 kg) IBW/kg (Calculated) : 66.1 Heparin Dosing Weight: n/a  Vital Signs: Temp: 97.4 F (36.3 C) (01/07 0453) Temp src: Oral (01/07 0453) BP: 108/63 mmHg (01/07 0453) Pulse Rate: 79 (01/07 0453)  Labs:  Recent Labs  04/08/13 0530 04/09/13 0331 04/10/13 0345  HGB  --   --  13.4  HCT  --   --  39.5  PLT  --   --  234  LABPROT  --  16.7* 14.9  INR  --  1.39 1.20  CREATININE 1.51* 1.28 1.12    Estimated Creatinine Clearance: 42.4 ml/min (by C-G formula based on Cr of 1.12).   Assessment: [age] YOM currently on pradaxa for Afib and mechanical mitral valve replacement. Pradaxa was stopped and switched to coumadin with lovenox bridge therapy. Day # 3 of coumadin. CBC wnl. CrCl improved to 42 mL/min. INR trended down to 1.2 likely due to clearance of Pradaxa with improving renal fx. Current INR likely the true value. Anticipate trend up tomorrow.    Goal of Therapy:  INR 2.5-3.5 Monitor platelets by anticoagulation protocol: Yes   Plan:  1. Lovenox 60 mg subQ BID till INR > 2  2. Repeat warfarin 5 mg x 1 dose tonight  3. F/u daily INR, CBC and s/s of bleeding    Vinnie LevelBenjamin Amillia Biffle, PharmD.  Clinical Pharmacist Pager [phone]

## 2013-04-10 NOTE — Progress Notes (Signed)
Patient assessed on RA at rest - sats 96%.  Pt ambulated in hall > 100 ft, sats dropped to 84%.  Returned to room and oxygen reapplied per RN.    Will arrange for home O2 with activity & QHS  Canary BrimBrandi Stefana Lodico, NP-C  Pulmonary & Critical Care Pgr: [phone] or [phone]

## 2013-04-11 ENCOUNTER — Encounter (INDEPENDENT_AMBULATORY_CARE_PROVIDER_SITE_OTHER): Payer: Medicare Other

## 2013-04-11 ENCOUNTER — Other Ambulatory Visit: Payer: Self-pay | Admitting: Cardiology

## 2013-04-11 ENCOUNTER — Encounter: Payer: Self-pay | Admitting: Cardiology

## 2013-04-11 DIAGNOSIS — I4891 Unspecified atrial fibrillation: Secondary | ICD-10-CM

## 2013-04-11 DIAGNOSIS — R001 Bradycardia, unspecified: Secondary | ICD-10-CM

## 2013-04-11 DIAGNOSIS — I482 Chronic atrial fibrillation, unspecified: Secondary | ICD-10-CM | POA: Diagnosis present

## 2013-04-11 DIAGNOSIS — R079 Chest pain, unspecified: Secondary | ICD-10-CM | POA: Diagnosis present

## 2013-04-11 DIAGNOSIS — I495 Sick sinus syndrome: Secondary | ICD-10-CM

## 2013-04-11 DIAGNOSIS — Z7901 Long term (current) use of anticoagulants: Secondary | ICD-10-CM

## 2013-04-11 LAB — PROTIME-INR
INR: 1.45 (ref 0.00–1.49)
Prothrombin Time: 17.3 seconds — ABNORMAL HIGH (ref 11.6–15.2)

## 2013-04-11 LAB — CBC
HCT: 39.5 % (ref 39.0–52.0)
Hemoglobin: 13.7 g/dL (ref 13.0–17.0)
MCH: 31.7 pg (ref 26.0–34.0)
MCHC: 34.7 g/dL (ref 30.0–36.0)
MCV: 91.4 fL (ref 78.0–100.0)
PLATELETS: 260 10*3/uL (ref 150–400)
RBC: 4.32 MIL/uL (ref 4.22–5.81)
RDW: 12.5 % (ref 11.5–15.5)
WBC: 6.4 10*3/uL (ref 4.0–10.5)

## 2013-04-11 LAB — CK: CK TOTAL: 23 U/L (ref 7–232)

## 2013-04-11 LAB — RHEUMATOID FACTOR: Rhuematoid fact SerPl-aCnc: <10 IU/mL (ref <=14)

## 2013-04-11 MED ORDER — BOOST / RESOURCE BREEZE PO LIQD: 1.0000 | Freq: Three times a day (TID) | ORAL | Status: DC

## 2013-04-11 MED ORDER — NITROGLYCERIN 0.4 MG SL SUBL: 0.4000 mg | SUBLINGUAL_TABLET | SUBLINGUAL | Status: AC | PRN

## 2013-04-11 MED ORDER — ENOXAPARIN SODIUM 100 MG/ML ~~LOC~~ SOLN: 90.0000 mg | SUBCUTANEOUS | Status: DC

## 2013-04-11 MED ORDER — DILTIAZEM HCL ER COATED BEADS 120 MG PO CP24
120.0000 mg | ORAL_CAPSULE | Freq: Every day | ORAL | Status: DC
  Administered 2013-04-11 – 2013-04-12 (×2): 120 mg via ORAL
  Filled 2013-04-11 (×2): qty 1

## 2013-04-11 MED ORDER — METOPROLOL SUCCINATE ER 25 MG PO TB24: 25.0000 mg | ORAL_TABLET | Freq: Every day | ORAL | Status: DC

## 2013-04-11 MED ORDER — WARFARIN SODIUM 5 MG PO TABS
5.0000 mg | ORAL_TABLET | ORAL | Status: AC
Start: 2013-04-11 — End: 2013-04-11
  Administered 2013-04-11: 5 mg via ORAL
  Filled 2013-04-11: qty 1

## 2013-04-11 MED ORDER — METOPROLOL SUCCINATE ER 25 MG PO TB24
25.0000 mg | ORAL_TABLET | Freq: Every day | ORAL | Status: DC
  Administered 2013-04-11 – 2013-04-12 (×2): 25 mg via ORAL
  Filled 2013-04-11 (×2): qty 1

## 2013-04-11 MED ORDER — WARFARIN SODIUM 5 MG PO TABS: ORAL_TABLET | ORAL | Status: DC

## 2013-04-11 MED ORDER — DILTIAZEM HCL ER COATED BEADS 120 MG PO CP24: 120.0000 mg | ORAL_CAPSULE | Freq: Every day | ORAL | Status: DC

## 2013-04-11 MED ORDER — ENOXAPARIN SODIUM 30 MG/0.3ML ~~LOC~~ SOLN
30.0000 mg | Freq: Once | SUBCUTANEOUS | Status: AC
  Administered 2013-04-11: 30 mg via SUBCUTANEOUS
  Filled 2013-04-11: qty 0.3

## 2013-04-11 MED ORDER — ACETAMINOPHEN 325 MG PO TABS: 650.0000 mg | ORAL_TABLET | ORAL | Status: DC | PRN

## 2013-04-11 MED ORDER — ENOXAPARIN SODIUM 100 MG/ML ~~LOC~~ SOLN
90.0000 mg | SUBCUTANEOUS | Status: DC
  Administered 2013-04-12: 90 mg via SUBCUTANEOUS
  Filled 2013-04-11: qty 1

## 2013-04-11 NOTE — Discharge Summary (Addendum)
Patient ID: Jordan Blackwell,  MRN: [id number], DOB/AGE: 78/26/1931 78 y.o.  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 Systolic function was normal. The estimated ejection fraction was in the range of 50% to 55%. Wall motion was normal; there were no regional wall motion abnormalities. - Ventricular septum: Septal motion showed paradox. These changes are consistent with RV volume and pressure overload. - Aortic valve: Mild regurgitation. - Left atrium: The atrium was severely dilated. - Right ventricle: The cavity size was moderately to severely dilated. Wall thickness was normal. - Right atrium: The atrium was severely dilated. - Tricuspid valve: Moderate regurgitation. - Pulmonary arteries: Systolic pressure was mildly increased. PA peak pressure: 31m Hg (S                         High resolution chest CT 04/09/12   [facility] Course:  80[facility]. He has a history of CAD, SD/P CABG in 1993 and a MV repair by Jordan Blackwell 2003. He has previously been on Pradaxa. He apparently has PAF with variable VR and recently his beta blocker had been stopped. [facility] with SOB and chest pain with rapid AF. He was  admitted and diuresed. Diltiazem and low Toprol were added for rate control. It was decided he should be on Coumadin as opposed to Pradaxa with valvular AF and he was crossed over with Loveneox. His rate is better controlled but he has had some slow rates and pause , 3 seconds. At discharge Jordan CStanford Breedcut his Toprol back to 25 mg daily and his Dil;tiazem to 120 mg daily. He was also seen by the pulmonary service for ISLD. He has a follow arranged. He is going home on home O2. His INR is not yet therapeutic and he will go home on Lovenox, (his daughter is an RTherapist, [facility]at [facility]. He will need an INR in the am. We will also arrange a 30 day monitor as an OP. The pt asked Jordan CStanford Breedto follow him as an OP and [facility] He'll be discharge later today if his monitor can be arranged. At some point he will need an OP Myoview, his Troponin's were negative.   **Addendum: [name] unable to be discharged on 1/8 as he required Home O2 and this was unable to be set up.  As a result, he stayed overnight without any change in his condition.  He will be discharged home today and we have arranged for coumadin clinic f/u in our office on Monday 1/12.  His INR today is 1.39.  He will be bridged with lovenox 49m daily (1.557mkg/daily) and coumadin 43m70mpm.  Discharge Vitals:  Blood pressure 112/63, pulse 53, temperature 97.3 F (36.3 C), temperature source Oral, resp. rate 18, height 5' 7" (1.702 m), weight 126 lb 5.2 oz (57.3 kg), SpO2 100.00%.    Labs: Results for orders placed during the Blackwell encounter of 04/06/13 (from the past 48 hour(s))  PROTIME-INR     Status: Abnormal   Collection Time    04/11/13  3:57 AM      Result Value Range   Prothrombin Time 17.3 (*) 11.6 - 15.2 seconds   INR 1.45  0.00 - 1.49  CBC     Status: None   Collection Time    04/11/13  3:57 AM      Result Value Range   WBC 6.4  4.0 - 10.5 K/uL   RBC 4.32  4.22 - 5.81 MIL/uL   Hemoglobin 13.7   13.0 - 17.0 g/dL   HCT 39.5  39.0 - 52.0 %   MCV 91.4  78.0 - 100.0 fL   MCH 31.7  26.0 - 34.0 pg   MCHC 34.7  30.0 - 36.0 g/dL   RDW 12.5  11.5 - 15.5 %   Platelets 260  150 - 400 K/uL  RHEUMATOID FACTOR     Status: None   Collection Time    04/11/13  3:57 AM      Result Value Range   Rheumatoid Factor <10  <=14 IU/mL   Comment: (NOTE)                             Interpretive Table                        Low Positive: 15 - 41 IU/mL                        High Positive:  >= 42 IU/mL     In addition to the RF result, and clinical symptoms including joint     involvement, the 2010 ACR Classification Criteria for     scoring/diagnosing Rheumatoid Arthritis include the results of the     following tests:  CRP (23([id number] (15010), and CCP (APCA) (82205397    www.rheumatology.org/practice/clinical/classification/ra/ra_2010.asp     Performed at [facility]     Status: None   Collection Time    04/11/13  3:57 AM      Result Value Range   Total CK 23  7 - 232 U/L  PROTIME-INR     Status: Abnormal   Collection Time    04/12/13  9:00 AM      Result Value Range   Prothrombin Time 16.7 (*) 11.6 - 15.2 seconds   INR 1.39  0.00 - 1.49    Disposition:      Follow-up Information   Follow up with Jordan Blackwell On 05/03/2013. (Appt at 3:30 PM)    Specialty:  Pulmonary Disease   Contact information:   [address] [place] [phone]    Follow up with Jordan Blackwell On 05/22/2013. (8:30 am- [facility]    Specialty:  Cardiology   Contact information:   [phone]       Follow up with Jordan Blackwell  Coumadin [facility] On 04/15/2013. (8:30 AM)    Contact information:   [address] [place] [address] [phone]      Follow up with Obtain a primary care provider.. (1-2 wks)       Discharge Medications:    Medication List     STOP taking these medications       dabigatran 150 MG Caps capsule  Commonly known as:  PRADAXA     diltiazem 180 MG 24 hr capsule  Commonly known as:  TIAZAC     lisinopril 2.5 MG tablet  Commonly known as:  PRINIVIL,ZESTRIL      TAKE these medications       acetaminophen 325 MG tablet  Commonly known as:  TYLENOL  Take 2 tablets (650 mg total) by mouth every 4 (four) hours as needed for headache or mild pain.     diltiazem 120 MG 24 hr capsule  Commonly known as:  CARDIZEM CD  Take 1 capsule (120 mg total) by mouth daily.     enoxaparin 100 MG/ML injection  Commonly known as:  LOVENOX  Inject 0.9 mLs (90 mg total) into the skin daily.     feeding supplement (RESOURCE BREEZE) Liqd  Take 1 Container by mouth 3 (three) times daily between meals.     metoprolol succinate 25 MG 24 hr tablet  Commonly known as:  TOPROL-XL  Take 1 tablet (25 mg total) by mouth daily.     multivitamin tablet  Take 1 tablet by mouth daily.     nitroGLYCERIN 0.4 MG SL tablet  Commonly known as:  NITROSTAT  Place 1 tablet (0.4 mg total) under the tongue every 5 (five) minutes as needed for chest pain.     pantoprazole 40 MG tablet  Commonly known as:  PROTONIX  Take 40 mg by mouth daily.     warfarin 5 MG tablet  Commonly known as:  COUMADIN  Take 1 tablet (5 mg total) by mouth daily at 6 PM. Take one tablet on odd days, 1/2 tablet on even days.       Duration of Discharge Encounter: Greater than 30 minutes including physician time.  Signed, Murray Hodgkins, NP  04/12/2013 10:13 AM

## 2013-04-11 NOTE — Progress Notes (Signed)
Subjective:  Up without problems. No further chest pain.  Objective:  Vital Signs in the last 24 hours: Temp:  [97.4 F (36.3 C)-97.7 F (36.5 C)] 97.6 F (36.4 C) (01/08 0513) Pulse Rate:  [76-78] 76 (01/08 0513) Resp:  [20] 20 (01/08 0513) BP: (105-123)/(50-68) 105/61 mmHg (01/08 0513) SpO2:  [96 %-99 %] 99 % (01/08 0513) Weight:  [127 lb (57.607 kg)] 127 lb (57.607 kg) (01/08 0513)  Intake/Output from previous day:  Intake/Output Summary (Last 24 hours) at 04/11/13 0759 Last data filed at 04/10/13 1824  Gross per 24 hour  Intake    720 ml  Output      0 ml  Net    720 ml    Physical Exam: General appearance: alert, cooperative, cachectic and no distress Lungs: fine crackles bilat Heart: irregularly irregular rhythm and 2/6 systolic murmur, softy diastolic murmur LSB Extremities: no edema   Rate: 40-110  Rhythm: atrial fibrillation and some periods of slow VR- 37  Lab Results:  Recent Labs  04/10/13 0345 04/11/13 0357  WBC 7.6 6.4  HGB 13.4 13.7  PLT 234 260    Recent Labs  04/09/13 0331 04/10/13 0345  NA 134* 135*  K 5.0 4.7  CL 93* 96  CO2 31 30  GLUCOSE 95 89  BUN 34* 32*  CREATININE 1.28 1.12   No results found for this basename: TROPONINI, CK, MB,  in the last 72 hours  Recent Labs  04/11/13 0357  INR 1.45    Imaging: Imaging results have been reviewed  Cardiac Studies:  Assessment/Plan:   Principal Problem:   Atrial fibrillation with rapid ventricular response Active Problems:   Acute diastolic CHF (congestive heart failure)   Chest pain with moderate risk of acute coronary syndrome- M/I r/o   S/P CABG (coronary artery bypass graft) 1993   S/P MVR (mitral valve repair)-2003   IPF (idiopathic pulmonary fibrosis)   Chronic anticoagulation- changed to Coumadin secondary MV disease   Chronic atrial fibrillation with a SSS component   HTN (hypertension)   Nodule of right lung    PLAN: 1. Afib: patient remains in permanent  atrial fibrillation. His rate is mildly decreased; change cardizem to 120 mg daily and change beta blocker to toprol 25 mg daily. His pradaxa was discontinued given history of valvular heart disease. Continue Lovenox and Coumadin. Lovenox will need to be continued until INR is greater than 2. This may be performed at home. DC on coumadin 5 mg alternating with 2.5 mg; check INR tomorrow in coumadin clinic; DC lovenox when INR > 2. Outpatient 30 day monitor at DC; hopefully can avoid pacemaker. 2. Acute diastolic CHF: In setting of rapid afib. Nl LVEF. RV is mod-sev dil (was mildly to mod dil in 2012). Volume looks good. Cont HR/BP control. No further diuresis.  3. HTN: controlled.  4. CAD: Had c/p on admission. No further chest pain. CE neg. Plan outpt mv.  5. Acute renal failure: BUN/Creat improved. Most likely represents prerenal azotemia in setting of diuresis. Volume looks good. No further diuresis.  6. ILD: Chest CT shows ILD, most likely UIP; also with pulmonary nodule requiring FU; home O2 arranged by pulmonary; fu chest CT 3 months with pulmonary; fu with them as outpt.  DC today and fu with me in 2-4 weeks > 30 min PA and physician time D2  Olga MillersBrian Lamont Tant, MD

## 2013-04-11 NOTE — Progress Notes (Signed)
ANTICOAGULATION CONSULT NOTE - Follow up Consult  Pharmacy Consult for Coumadin, Lovenox Indication: Afib and MVR  No Known Allergies  Patient Measurements: Height: 5\' 7"  (170.2 cm) Weight: 127 lb (57.607 kg) IBW/kg (Calculated) : 66.1 Heparin Dosing Weight: n/a  Vital Signs: Temp: 97.6 F (36.4 C) (01/08 0513) Temp src: Oral (01/08 0513) BP: 105/61 mmHg (01/08 0513) Pulse Rate: 76 (01/08 0513)  Labs:  Recent Labs  04/09/13 0331 04/10/13 0345 04/11/13 0357  HGB  --  13.4 13.7  HCT  --  39.5 39.5  PLT  --  234 260  LABPROT 16.7* 14.9 17.3*  INR 1.39 1.20 1.45  CREATININE 1.28 1.12  --   CKTOTAL  --   --  23    Estimated Creatinine Clearance: 42.1 ml/min (by C-G formula based on Cr of 1.12).   Assessment: [age] YOM currently on pradaxa for Afib and mechanical mitral valve replacement. Pradaxa was stopped and switched to coumadin with lovenox bridge therapy. Day # 4 of coumadin. CBC wnl. CrCl improved to 42 mL/min. INR has finally started to trend up. Currently at 1.45. Likely b/c pradaxa has been completely cleared. No bleeding reported per patient.   Goal of Therapy:  INR 2.5-3.5 Monitor platelets by anticoagulation protocol: Yes   Plan:  1. Lovenox 60 mg subQ BID till INR > 2  2. Per cardiology, plans to discharge today on alternating coumadin doses of 5 mg and 2.5 mg till f/u appt  3. Monitor patient for s/s of bleeding  Vinnie LevelBenjamin Janiyha Montufar, PharmD.  Clinical Pharmacist Pager [phone]

## 2013-04-11 NOTE — Discharge Summary (Signed)
See progress notes Jordan Blackwell  

## 2013-04-11 NOTE — Progress Notes (Addendum)
Pt 94% on room air at rest, sats dropped to 84% during ambulation >100 feet,  Sats increased to 100% on 2liters while ambulating. No SOB. Pt resting with call bell within reach.  Will continue to monitor. Thomas HoffBurton, Valma Rotenberg McClintock

## 2013-04-12 LAB — MPO/PR-3 (ANCA) ANTIBODIES: Serine Protease 3: <1 AU/mL (ref <20)

## 2013-04-12 LAB — ANA: ANA: NEGATIVE

## 2013-04-12 LAB — SJOGRENS SYNDROME-A EXTRACTABLE NUCLEAR ANTIBODY: SSA (Ro) (ENA) Antibody, IgG: 13 AU/mL (ref <30)

## 2013-04-12 LAB — PROTIME-INR
INR: 1.39 (ref 0.00–1.49)
Prothrombin Time: 16.7 seconds — ABNORMAL HIGH (ref 11.6–15.2)

## 2013-04-12 LAB — ANTI-SCLERODERMA ANTIBODY: Scleroderma (Scl-70) (ENA) Antibody, IgG: 2 AU/mL (ref <30)

## 2013-04-12 LAB — CYCLIC CITRUL PEPTIDE ANTIBODY, IGG

## 2013-04-12 LAB — ANTI-DNA ANTIBODY, DOUBLE-STRANDED: ds DNA Ab: 3 IU/mL (ref <30)

## 2013-04-12 LAB — SJOGRENS SYNDROME-B EXTRACTABLE NUCLEAR ANTIBODY: SSB (La) (ENA) Antibody, IgG: <1 AU/mL (ref <30)

## 2013-04-12 MED ORDER — WARFARIN SODIUM 5 MG PO TABS: 5.0000 mg | ORAL_TABLET | Freq: Every day | ORAL | Status: DC

## 2013-04-12 NOTE — Progress Notes (Signed)
Pt/family given discharge instructions, medication lists, follow up appointments, and when to call the doctor.  Pt/family verbalizes understanding. Paige Monarrez McClintock    

## 2013-04-12 NOTE — Progress Notes (Signed)
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  He continues to feel well and is eager to go home.  No chest pain or significant dyspnea.  CURRENT MEDS . diltiazem  120 mg Oral Daily  . enoxaparin (LOVENOX) injection  90 mg Subcutaneous Q24H  . feeding supplement (RESOURCE BREEZE)  1 Container Oral TID BM  . metoprolol succinate  25 mg Oral Daily  . pantoprazole  40 mg Oral Daily  . sodium chloride  3 mL Intravenous Q12H  . Warfarin - Pharmacist Dosing Inpatient   Does not apply q1800    OBJECTIVE  Filed Vitals:   04/11/13 0513 04/11/13 1419 04/11/13 1928 04/12/13 0500  BP: 105/61 95/55 110/53 112/63  Pulse: 76 76 81 53  Temp: 97.6 F (36.4 C) 97.4 F (36.3 C) 97.7 F (36.5 C) 97.3 F (36.3 C)  TempSrc: Oral Oral Oral Oral  Resp: 20 18 18 18   Height:      Weight: 127 lb (57.607 kg)   126 lb 5.2 oz (57.3 kg)  SpO2: 99% 99% 100% 100%    Intake/Output Summary (Last 24 hours) at 04/12/13 0651 Last data filed at 04/11/13 1808  Gross per 24 hour  Intake   1040 ml  Output      3 ml  Net   1037 ml   Filed Weights   04/10/13 0453 04/11/13 0513 04/12/13 0500  Weight: 127 lb 10.3 oz (57.9 kg) 127 lb (57.607 kg) 126 lb 5.2 oz (57.3 kg)    PHYSICAL EXAM  General: Pleasant,  NAD. Neuro: Alert and oriented X 3. Moves all extremities spontaneously. Psych: Normal affect. HEENT:  Normal  Neck: Supple without bruits or JVD. Lungs:  Resp regular and unlabored, bibasilar crackles. Heart: ir, ir no s3, s4, or murmurs. Abdomen: Soft, non-tender, non-distended, BS + x 4.  Extremities: No clubbing, cyanosis or edema. DP/PT/Radials 2+ and equal bilaterally.  Accessory Clinical Findings  CBC  Recent Labs  04/10/13 0345 04/11/13 0357  WBC 7.6 6.4  HGB 13.4 13.7  HCT 39.5 39.5  MCV 92.9 91.4  PLT 234 260   Basic Metabolic Panel  Recent Labs  04/10/13 0345  NA 135*  K 4.7  CL 96  CO2 30  GLUCOSE 89  BUN 32*  CREATININE 1.12  CALCIUM 9.5   Cardiac Enzymes  Recent Labs  04/11/13 0357  CKTOTAL 23   Lab Results  Component Value Date   INR 1.45 04/11/2013   INR 1.20 04/10/2013   INR 1.39 04/09/2013    TELE  Not on tele  Radiology/Studies  Ct Chest High Resolution  04/09/2013   CLINICAL DATA:  Shortness of breath.  Possible pulmonary fibrosis.  EXAM: CHEST CT WITHOUT CONTRAST   IMPRESSION: 1. The appearance of  the lung parenchyma is compatible with an underlying interstitial lung disease. Based on the definitive craniocaudal gradient and the presence of areas that appear to represent early honeycombing, this is strongly favored to represent early usual interstitial pneumonia (UIP), and less likely to represent fibrotic phase nonspecific interstitial pneumonia (NSIP). Followup evaluation with repeat high-resolution chest CT would be useful to assess for temporal changes in the lung parenchyma. 2. Today's study does demonstrate a 9 x 7 mm pleural-based nodule in the apex of the right upper lobe (image 10 of series 3), which is nonspecific. There is a good chance that this is simply a nodular focus of chronic pleuroparenchymal scarring, however, without prior studies to compare with the stability of this nodule is uncertain. Attention to this lesion on  followup high-resolution chest CT in 3-6 months is recommended. 3. Atherosclerosis, including left main and 3 vessel coronary artery disease. Status post CABG and mitral annuloplasty. 4. Cardiomegaly with right-sided cardiac enlargement. In addition, there is dilatation of the pulmonic trunk (3.7 cm in diameter), suggesting pulmonary arterial hypertension. 5. Multiple borderline enlarged and mildly enlarged mediastinal and hilar lymph nodes are favored to be reactive in the setting of chronic interstitial lung disease.   Electronically Signed   By: Trudie Reed M.D.   On: 04/09/2013 11:02   ASSESSMENT AND PLAN  1.  Permanent atrial fibrillation:  No longer on tele.  Rates stable by VS.  No presyncope or palpitations.  INR pending this AM. Cont dilt/bb/lovenox/coumadin.  Await INR this AM.  If Rx, d/c lovenox, otw plan to continue over the weekend with INR in our office on Monday.  Event recorder placed yesterday to assess rate control/?bradycardia on current meds.  Hopefully can avoid PPM.  2.  Acute diastolic CHF:  In setting of rapid afib.  Rate controlled.  Volume stable.  BP stable.  Cont current meds.  Crackles on exam 2/2 UIP/ILD.  3.  UIP/ILD:  Appreciate pulmonology eval.  He has outpt f/u.  Home O2.  4.  HTN:  Stabel.  5.  CAD:  Had c/p on admission.  None since.  CE neg.  Will schedule outpt lexiscan CL.  5.  Acute renal failure:  BUN/Creat stable on 1/7.  Suspect elevations were 2/2 pre-renal azotemia in setting of diuresis.  6.  S/P MVR:  Mild MR by echo this admission.  Signed, Nicolasa Ducking NP As above, patient seen and examined. He denies chest pain or dyspnea. Heart rate is stable on vitals. Await INR. If therapeutic DC today on Coumadin with INR check in Coumadin clinic on Monday. If subtherapeutic continue Lovenox over the weekend. Followup with pulmonary as outlined in previous note. Followup with me as well. Monitor in place. >30 min PA and physician time D2 Olga Millers 7:41 AM

## 2013-04-12 NOTE — Discharge Summary (Signed)
See progress notes Jordan Blackwell  

## 2013-04-13 ENCOUNTER — Telehealth: Payer: Self-pay | Admitting: Physician Assistant

## 2013-04-13 LAB — ALDOLASE: Aldolase: 6.2 U/L (ref <=8.1)

## 2013-04-13 NOTE — Telephone Encounter (Signed)
[facility] nurse called because patient did not have an INR order. INR was checked and was 1.7. Requested she continue current therapy and recheck in a.m. No other issues or concerns.

## 2013-04-14 ENCOUNTER — Telehealth: Payer: Self-pay | Admitting: Physician Assistant

## 2013-04-14 NOTE — Telephone Encounter (Signed)
Jordan Blackwell with [facility]. INR 2.3 today. Hold Lovenox, continue Coumadin as prescribed on discharge. Repeat INR on 1/13. Further recs at that time. Will schedule Coumadin clinic appointment for next week to establish. She understood and agreed.   [name]. Tony Aaran Enberg, PA-C 04/14/2013 5:03 PM

## 2013-04-16 ENCOUNTER — Ambulatory Visit (INDEPENDENT_AMBULATORY_CARE_PROVIDER_SITE_OTHER): Payer: Medicare Other | Admitting: Pharmacist

## 2013-04-16 DIAGNOSIS — Z7901 Long term (current) use of anticoagulants: Secondary | ICD-10-CM | POA: Insufficient documentation

## 2013-04-16 DIAGNOSIS — I4891 Unspecified atrial fibrillation: Secondary | ICD-10-CM

## 2013-04-16 LAB — POCT INR: INR: 2.1

## 2013-04-22 ENCOUNTER — Ambulatory Visit (INDEPENDENT_AMBULATORY_CARE_PROVIDER_SITE_OTHER): Payer: Medicare Other | Admitting: Pharmacist

## 2013-04-22 DIAGNOSIS — I4891 Unspecified atrial fibrillation: Secondary | ICD-10-CM

## 2013-04-22 DIAGNOSIS — Z7901 Long term (current) use of anticoagulants: Secondary | ICD-10-CM

## 2013-04-22 LAB — POCT INR: INR: 2.1

## 2013-04-29 ENCOUNTER — Ambulatory Visit (INDEPENDENT_AMBULATORY_CARE_PROVIDER_SITE_OTHER): Payer: Medicare Other | Admitting: Pharmacist

## 2013-04-29 DIAGNOSIS — Z7901 Long term (current) use of anticoagulants: Secondary | ICD-10-CM

## 2013-04-29 DIAGNOSIS — I4891 Unspecified atrial fibrillation: Secondary | ICD-10-CM

## 2013-04-29 LAB — POCT INR: INR: 2.7

## 2013-05-03 ENCOUNTER — Inpatient Hospital Stay: Payer: Medicare Other | Admitting: Internal Medicine

## 2013-05-09 ENCOUNTER — Ambulatory Visit (INDEPENDENT_AMBULATORY_CARE_PROVIDER_SITE_OTHER): Payer: Medicare Other | Admitting: *Deleted

## 2013-05-09 DIAGNOSIS — Z7901 Long term (current) use of anticoagulants: Secondary | ICD-10-CM

## 2013-05-09 DIAGNOSIS — I4891 Unspecified atrial fibrillation: Secondary | ICD-10-CM

## 2013-05-09 DIAGNOSIS — Z5181 Encounter for therapeutic drug level monitoring: Secondary | ICD-10-CM

## 2013-05-09 LAB — POCT INR: INR: 3.1

## 2013-05-20 ENCOUNTER — Encounter: Payer: Self-pay | Admitting: Cardiology

## 2013-05-20 ENCOUNTER — Ambulatory Visit (INDEPENDENT_AMBULATORY_CARE_PROVIDER_SITE_OTHER): Payer: Medicare Other | Admitting: *Deleted

## 2013-05-20 ENCOUNTER — Telehealth (HOSPITAL_COMMUNITY): Payer: Self-pay

## 2013-05-20 DIAGNOSIS — I4891 Unspecified atrial fibrillation: Secondary | ICD-10-CM

## 2013-05-20 DIAGNOSIS — Z5181 Encounter for therapeutic drug level monitoring: Secondary | ICD-10-CM

## 2013-05-20 LAB — POCT INR: INR: 2.7

## 2013-05-20 NOTE — Telephone Encounter (Signed)
I have called and left a message with Lauren to inquire about participation in Pulmonary Rehab. Will send letter in mail and follow up. 

## 2013-05-22 ENCOUNTER — Encounter: Payer: Medicare Other | Admitting: Cardiology

## 2013-05-23 ENCOUNTER — Telehealth: Payer: Self-pay | Admitting: *Deleted

## 2013-05-23 ENCOUNTER — Encounter: Payer: Self-pay | Admitting: Internal Medicine

## 2013-05-23 ENCOUNTER — Ambulatory Visit (INDEPENDENT_AMBULATORY_CARE_PROVIDER_SITE_OTHER): Payer: Medicare Other | Admitting: Internal Medicine

## 2013-05-23 VITALS — BP 136/92 | HR 71 | Ht 67.0 in | Wt 135.0 lb

## 2013-05-23 DIAGNOSIS — I2510 Atherosclerotic heart disease of native coronary artery without angina pectoris: Secondary | ICD-10-CM

## 2013-05-23 DIAGNOSIS — J84112 Idiopathic pulmonary fibrosis: Secondary | ICD-10-CM

## 2013-05-23 DIAGNOSIS — R911 Solitary pulmonary nodule: Secondary | ICD-10-CM

## 2013-05-23 NOTE — Telephone Encounter (Signed)
Spoke with pt, aware monitor reviewed by dr Jens Somcrenshaw shows atrial fib with pvc's or aberrantly conducted beats; rate controlled.

## 2013-05-23 NOTE — Assessment & Plan Note (Signed)
#  Lung nodule 9mm RUL apex  - CT chest in 3 months

## 2013-05-23 NOTE — Assessment & Plan Note (Signed)
#  IPF - high probabiltiy you have IPF - progressive disease over few to several years  - Both drugs OFEV and Esbriet only slow down progression, 1 out of 6 patients  - this means extension in quality of life but no difference in symptoms   -  OFEV  - twice daily, no titration, no need for sunscreen but high chance of diarrhea and need lomotil and sligh increase in heart attack risk and theoretical increase in bleeding risk, need monthly blood work for 3 months and thenevery 6 months  - time to firse exacerbation possibly reduced  - ESBRIET  - 3 pill three times daily, slow titration. Need to wean sunscreen, small chance of nausea and anorexia but no diarrhea, no heart attack risk, no bleeding risk, need monthly blood work for 6 months  - possible mortality benefit  - Please think about above  - work with my P[facility] to get portable o2 system; wear it for anything more than 100 yards    #FOllowup  3 months with PFT  3months with CT scan chest wo contrast  > 50% of this > 25 min visit spent in face to face counseling (15 min visit converted to 25 min)

## 2013-05-23 NOTE — Patient Instructions (Addendum)
#  IPF - high probabiltiy you have IPF - progressive disease over few to several years  - Both drugs OFEV and Esbriet only slow down progression, 1 out of 6 patients  - this means extension in quality of life but no difference in symptoms   -  OFEV  - twice daily, no titration, no need for sunscreen but high chance of diarrhea and need lomotil and sligh increase in heart attack risk and theoretical increase in bleeding risk, need monthly blood work for 3 months and thenevery 6 months  - time to firse exacerbation possibly reduced  - ESBRIET  - 3 pill three times daily, slow titration. Need to wean sunscreen, small chance of nausea and anorexia but no diarrhea, no heart attack risk, no bleeding risk, need monthly blood work for 6 months  - possible mortality benefit  - Please think about above  - work with my P[facility] to get portable o2 system; wear it for anything more than 100 yards  #Lung nodule 9mm RUL apex  - CT chest in 3 months   #FOllowup  3 months with PFT  3months with CT scan chest wo contrast

## 2013-05-23 NOTE — Progress Notes (Signed)
Subjective:    Patient ID: Jordan Blackwell, male    DOB: 11/28/1929, 80 y.o.   MRN: [id number]  HPI\  Hospital Consult 04/10/13: 80 y/o M with PMH of CAD s/p CABG, HTN, MVR on chronic anticoagulation, CVA and afib who presented [facility] on 1/3 with complaints of chest pain and progressive shortness of breath. He was evaluated and found to be in afib with RVR. Patient was treated with NTG, lasix & cardizem. Patient admitted for rate control and further evaluation per Cardiology. ECHO was evaluated which demonstrated an EF 50-55%, nml wall motion, RV mod-severe dilated, RA severely dilated, PA Peak 34 mm Hg. Cardiac enzymes neg x3. Hospital course noted mildly soft BP's with lightheadedness and intermittent pauses. Despite diuresis, he continued to have dry crackles on exam and was evaluated with a high resolution CT of chest which was concerning for ILD / UIP. PCCM consulted for further evaluation.  At baseline, patient is very active, continues to mow yard, ambulates without assistance, lives alone and manages a 5 br home with yard work. He has had progressive mild SOB since summer 2014 but specific worsening since Thanksgiving 2014. He noted that he had more difficulty walking 200 yards than usual. Denies known illness, fevers,chills, sputum production, cough. Reports generalized weakness / decreased activity tolerance since summer. Noted >20 lb weight loss.    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 Wife smoked 3ppd in home & car, died of small cell lung ca  Pets: none  Medications: never on amiodarone    ASSESSMENT Age > 60, Classic UIP Ct, negative physical exam for autoimmune disease, Male sex = High clinical pre-test prob for IPF. No need for lung biopsy . DIagnosed in hosptial  04/10/13  OV 05/23/2013  Chief Complaint  Patient presents with  . Follow-up    HFU-States breathing has been the same since hospital d/c. SOB with activity. Denies CP, chest tightness, cough. Pt states he could hear "gurgles" deep in his chest and "the sounds goes away with O2".   Presents with daughter Jordan Blackwell  Now 05/23/2013 much improved. Here to discuss IPF Rx decisions. Has class 2-3 DOE relieved by rest. Feeling well. Here with Jordan Blackwell daughter. Lot of questions; he values his quality of life. HE is risk averse with medications. HE feels he only has few to several years left to live and wants to factor this in context of IPF drug Rx.   Wak test 87% on 185 feet x 2nd lap on RA  Review of Systems  Constitutional: Negative for fever and unexpected weight change.  HENT: Positive for nosebleeds, postnasal drip, rhinorrhea, sinus pressure and trouble swallowing. Negative for congestion, dental problem, ear pain, sneezing and sore throat.   Eyes: Negative for redness and itching.  Respiratory: Positive for shortness of breath. Negative for cough, chest tightness and wheezing.   Cardiovascular: Negative for palpitations and leg swelling.  Gastrointestinal: Negative for nausea and vomiting.  Genitourinary: Negative for dysuria.  Musculoskeletal: Negative for joint swelling.  Skin: Negative for rash.  Neurological: Negative for headaches.  Hematological: Does not bruise/bleed easily.  Psychiatric/Behavioral: Negative for dysphoric mood. The patient is not nervous/anxious.        Objective:   Physical Exam  Nursing note  and vitals reviewed. Constitutional: He is oriented to person, place, and time. He appears well-developed and well-nourished. No distress.  Looks better  HENT:  Head: Normocephalic and atraumatic.  Right Ear: External ear normal.  Left Ear: External ear normal.  Mouth/Throat: Oropharynx is clear and moist. No oropharyngeal exudate.  Eyes: Conjunctivae and EOM are normal.  Pupils are equal, round, and reactive to light. Right eye exhibits no discharge. Left eye exhibits no discharge. No scleral icterus.  Neck: Normal range of motion. Neck supple. No JVD present. No tracheal deviation present. No thyromegaly present.  Cardiovascular: Normal rate, regular rhythm and intact distal pulses.  Exam reveals no gallop and no friction rub.   No murmur heard. Pulmonary/Chest: Effort normal. No respiratory distress. He has no wheezes. He has rales. He exhibits no tenderness.  Bilateral crackles  Abdominal: Soft. Bowel sounds are normal. He exhibits no distension and no mass. There is no tenderness. There is no rebound and no guarding.  Musculoskeletal: Normal range of motion. He exhibits no edema and no tenderness.  Lymphadenopathy:    He has no cervical adenopathy.  Neurological: He is alert and oriented to person, place, and time. He has normal reflexes. No cranial nerve deficit. Coordination normal.  Skin: Skin is warm and dry. No rash noted. He is not diaphoretic. No erythema. No pallor.  Psychiatric: He has a normal mood and affect. His behavior is normal. Judgment and thought content normal.          Assessment & Plan:

## 2013-05-29 ENCOUNTER — Other Ambulatory Visit: Payer: Self-pay

## 2013-05-29 NOTE — Telephone Encounter (Signed)
Patient was seen in the hospital and has a follow-up appointment on 06/26/2013 with Dr Olga MillersBrian Crenshaw. Rx(s) deferred to Dr Ludwig Clarksrenshaw's office.

## 2013-05-31 ENCOUNTER — Other Ambulatory Visit: Payer: Self-pay | Admitting: *Deleted

## 2013-05-31 MED ORDER — METOPROLOL SUCCINATE ER 25 MG PO TB24: 25.0000 mg | ORAL_TABLET | Freq: Every day | ORAL | Status: DC

## 2013-05-31 MED ORDER — DILTIAZEM HCL ER COATED BEADS 120 MG PO CP24: 120.0000 mg | ORAL_CAPSULE | Freq: Every day | ORAL | Status: DC

## 2013-06-04 ENCOUNTER — Other Ambulatory Visit: Payer: Self-pay

## 2013-06-04 MED ORDER — METOPROLOL SUCCINATE ER 25 MG PO TB24: 25.0000 mg | ORAL_TABLET | Freq: Every day | ORAL | Status: DC

## 2013-06-04 MED ORDER — WARFARIN SODIUM 5 MG PO TABS: 5.0000 mg | ORAL_TABLET | Freq: Every day | ORAL | Status: DC

## 2013-06-04 MED ORDER — DILTIAZEM HCL ER COATED BEADS 120 MG PO CP24: 120.0000 mg | ORAL_CAPSULE | Freq: Every day | ORAL | Status: DC

## 2013-06-04 NOTE — Telephone Encounter (Signed)
Rx was sent to pharmacy electronically. 

## 2013-06-12 ENCOUNTER — Inpatient Hospital Stay (HOSPITAL_COMMUNITY): Admission: RE | Admit: 2013-06-12 | Payer: Medicare Other | Source: Ambulatory Visit

## 2013-06-17 ENCOUNTER — Ambulatory Visit (INDEPENDENT_AMBULATORY_CARE_PROVIDER_SITE_OTHER): Payer: Medicare Other | Admitting: *Deleted

## 2013-06-17 DIAGNOSIS — Z5181 Encounter for therapeutic drug level monitoring: Secondary | ICD-10-CM

## 2013-06-17 DIAGNOSIS — I4891 Unspecified atrial fibrillation: Secondary | ICD-10-CM

## 2013-06-17 LAB — POCT INR: INR: 3.5

## 2013-06-26 ENCOUNTER — Encounter: Payer: Medicare Other | Admitting: Cardiology

## 2013-07-08 ENCOUNTER — Ambulatory Visit (INDEPENDENT_AMBULATORY_CARE_PROVIDER_SITE_OTHER): Payer: Medicare Other

## 2013-07-08 DIAGNOSIS — Z5181 Encounter for therapeutic drug level monitoring: Secondary | ICD-10-CM

## 2013-07-08 DIAGNOSIS — I4891 Unspecified atrial fibrillation: Secondary | ICD-10-CM

## 2013-07-08 LAB — POCT INR: INR: 2.9

## 2013-07-24 ENCOUNTER — Ambulatory Visit (INDEPENDENT_AMBULATORY_CARE_PROVIDER_SITE_OTHER): Payer: Medicare Other | Admitting: Cardiology

## 2013-07-24 ENCOUNTER — Encounter: Payer: Self-pay | Admitting: Cardiology

## 2013-07-24 VITALS — BP 138/82 | HR 70 | Ht 67.0 in | Wt 131.0 lb

## 2013-07-24 DIAGNOSIS — I4891 Unspecified atrial fibrillation: Secondary | ICD-10-CM

## 2013-07-24 DIAGNOSIS — J84112 Idiopathic pulmonary fibrosis: Secondary | ICD-10-CM

## 2013-07-24 DIAGNOSIS — I482 Chronic atrial fibrillation, unspecified: Secondary | ICD-10-CM

## 2013-07-24 DIAGNOSIS — Z9889 Other specified postprocedural states: Secondary | ICD-10-CM

## 2013-07-24 DIAGNOSIS — I10 Essential (primary) hypertension: Secondary | ICD-10-CM

## 2013-07-24 DIAGNOSIS — Z951 Presence of aortocoronary bypass graft: Secondary | ICD-10-CM

## 2013-07-24 DIAGNOSIS — R911 Solitary pulmonary nodule: Secondary | ICD-10-CM

## 2013-07-24 DIAGNOSIS — I2510 Atherosclerotic heart disease of native coronary artery without angina pectoris: Secondary | ICD-10-CM

## 2013-07-24 NOTE — Assessment & Plan Note (Signed)
Continue SBE prophylaxis. 

## 2013-07-24 NOTE — Assessment & Plan Note (Addendum)
Not on aspirin given need for Coumadin. Intolerant to statins. Schedule nuclear study for risk stratification.

## 2013-07-24 NOTE — Assessment & Plan Note (Signed)
Followup pulmonary as scheduled.

## 2013-07-24 NOTE — Patient Instructions (Signed)
Your physician wants you to follow-up in: 6 MONTHS WITH DR CRENSHAW You will receive a reminder letter in the mail two months in advance. If you don't receive a letter, please call our office to schedule the follow-up appointment.   Your physician has requested that you have a lexiscan myoview. For further information please visit [email or web address]. Please follow instruction sheet, as given.   

## 2013-07-24 NOTE — Assessment & Plan Note (Signed)
Blood pressure controlled. Continue present medications. 

## 2013-07-24 NOTE — Assessment & Plan Note (Signed)
Management per pulmonary. 

## 2013-07-24 NOTE — Progress Notes (Signed)
HPI: Followup atrial fibrillation. Patient is status post coronary artery bypass graft in 1993 and mitral valve repair in 2003. Abdominal CT in December 2012 showed no aneurysm. Admitted with atrial fibrillation with rapid ventricular response in January 2015. Echocardiogram in January 2015 showed normal LV function, severe biatrial enlargement, moderate tricuspid regurgitation and mild aortic insufficiency. High-resolution chest CT showed interstitial lung disease (UIP). There was a nodule in the right upper lobe. The pulmonary artery was dilated. Patient was treated with rate controlling medications and Coumadin. Nuclear study was planned as an outpatient. Monitor in January of 2015 showed atrial fibrillation with adequate rate control. Since he was last seen, Some dyspnea on exertion which she attributes to pulmonary fibrosis. No chest pain, palpitations, syncope, bleeding or pedal edema.  Current Outpatient Prescriptions  Medication Sig Dispense Refill  . acetaminophen (TYLENOL) 325 MG tablet Take 2 tablets (650 mg total) by mouth every 4 (four) hours as needed for headache or mild pain.      Marland Kitchen. diltiazem (CARDIZEM CD) 120 MG 24 hr capsule Take 1 capsule (120 mg total) by mouth daily.  90 capsule  0  . metoprolol succinate (TOPROL-XL) 25 MG 24 hr tablet Take 1 tablet (25 mg total) by mouth daily.  90 tablet  0  . Multiple Vitamin (MULTIVITAMIN) tablet Take 1 tablet by mouth daily.        . nitroGLYCERIN (NITROSTAT) 0.4 MG SL tablet Place 1 tablet (0.4 mg total) under the tongue every 5 (five) minutes as needed for chest pain.  25 tablet  2  . pantoprazole (PROTONIX) 40 MG tablet Take 40 mg by mouth daily.        Marland Kitchen. warfarin (COUMADIN) 5 MG tablet Take 1 tablet (5 mg total) by mouth daily at 6 PM. Take one tablet on odd days, 1/2 tablet on even days.  90 tablet  0   No current facility-administered medications for this visit.     Past Medical History  Diagnosis Date  . Coronary artery  disease   . Stroke   . Atrial fibrillation   . Atrial thrombus   . Hypertension   . B12 deficiency   . Folic acid deficiency   . Mitral valve regurgitation     Past Surgical History  Procedure Laterality Date  . Inguinal hernia repair    . Coronary artery bypass graft      1993  . Mitral valve anuloplasty      2003 [facility]  . Cholecystectomy    . Ankle fracture surgery    . Appendectomy    . Abdominal adhesion surgery    . Esophageal dilatation    . Cataract extraction    . Mitral valve repair N/A 2003    History   Social History  . Marital Status: Widowed    Spouse Name: N/A    Number of Children: 1  . Years of Education: N/A   Occupational History  . Not on file.   Social History Main Topics  . Smoking status: Former Smoker    Types: Cigarettes  . Smokeless tobacco: Never Used     Comment: Quit 50 years ago.  . Alcohol Use: No  . Drug Use: No  . Sexual Activity: No   Other Topics Concern  . Not on file   Social History Narrative   Lives alone.          ROS: no fevers or chills, productive cough, hemoptysis, dysphasia, odynophagia, melena, hematochezia, dysuria, hematuria,  rash, seizure activity, orthopnea, PND, pedal edema, claudication. Remaining systems are negative.  Physical Exam: Well-developed well-nourished in no acute distress.  Skin is warm and dry.  HEENT is normal.  Neck is supple.  Chest With mild basilar crackles Cardiovascular exam is irregular Abdominal exam nontender or distended. No masses palpated. Extremities show no edema. neuro grossly intact

## 2013-07-24 NOTE — Assessment & Plan Note (Signed)
Continue Cardizem and Toprol. Rate is controlled. Continue Coumadin.

## 2013-08-08 ENCOUNTER — Ambulatory Visit (HOSPITAL_COMMUNITY): Payer: Medicare Other | Attending: Cardiology | Admitting: Radiology

## 2013-08-08 ENCOUNTER — Ambulatory Visit (INDEPENDENT_AMBULATORY_CARE_PROVIDER_SITE_OTHER): Payer: Medicare Other

## 2013-08-08 VITALS — BP 148/87 | HR 81 | Ht 67.0 in | Wt 128.0 lb

## 2013-08-08 DIAGNOSIS — I2510 Atherosclerotic heart disease of native coronary artery without angina pectoris: Secondary | ICD-10-CM | POA: Insufficient documentation

## 2013-08-08 DIAGNOSIS — I4891 Unspecified atrial fibrillation: Secondary | ICD-10-CM

## 2013-08-08 DIAGNOSIS — R079 Chest pain, unspecified: Secondary | ICD-10-CM | POA: Insufficient documentation

## 2013-08-08 DIAGNOSIS — Z5181 Encounter for therapeutic drug level monitoring: Secondary | ICD-10-CM

## 2013-08-08 DIAGNOSIS — R0602 Shortness of breath: Secondary | ICD-10-CM

## 2013-08-08 LAB — POCT INR: INR: 2.9

## 2013-08-08 MED ORDER — REGADENOSON 0.4 MG/5ML IV SOLN
0.4000 mg | Freq: Once | INTRAVENOUS | Status: AC
  Administered 2013-08-08: 0.4 mg via INTRAVENOUS

## 2013-08-08 MED ORDER — TECHNETIUM TC 99M SESTAMIBI GENERIC - CARDIOLITE
11.0000 | Freq: Once | INTRAVENOUS | Status: AC | PRN
  Administered 2013-08-08: 11 via INTRAVENOUS

## 2013-08-08 MED ORDER — TECHNETIUM TC 99M SESTAMIBI GENERIC - CARDIOLITE
33.0000 | Freq: Once | INTRAVENOUS | Status: AC | PRN
  Administered 2013-08-08: 33 via INTRAVENOUS

## 2013-08-08 NOTE — Progress Notes (Signed)
[facility] SITE 3 NUCLEAR MED [address] [place], [place] [address] [phone]    Cardiology Nuclear Med Study  Jordan DevoidJoseph L Grein is a 80 y.o. male     MRN : [id number]     DOB: [birth year]  Procedure Date: 08/08/2013  Nuclear Med Background Indication for Stress Test:  Evaluation for Ischemia, Stent Patency and Post Hospital 1/ 15 A-Fib History:  CAD, Cath, CABG MVR, Afib, Echo 2015 EF 50-55%, MPI (? results done in [place]), Idiopathic pulmonary fibrosis Cardiac Risk Factors: CVA, Family History - CAD, History of Smoking and Hypertension  Symptoms:  DOE   Nuclear Pre-Procedure Caffeine/Decaff Intake:  None NPO After: 6:00am   Lungs:  clear O2 Sat: 93% on room air. IV 0.9% NS with Angio Cath:  22g  IV Site: R Antecubital  IV Started by:  Bonnita LevanJackie Smith, RN  Chest Size (in):  40 Cup Size: n/a  Height: 5\' 7"  (1.702 m)  Weight:  128 lb (58.06 kg)  BMI:  Body mass index is 20.04 kg/(m^2). Tech Comments:  N/A    Nuclear Med Study 1 or 2 day study: 1 day  Stress Test Type:  Lexiscan  Reading MD: N/A  Order Authorizing Provider:  Olga MillersBrian Crenshaw, MD  Resting Radionuclide: Technetium 3467m Sestamibi  Resting Radionuclide Dose: 11.0 mCi   Stress Radionuclide:  Technetium 8867m Sestamibi  Stress Radionuclide Dose: 33.0 mCi           Stress Protocol Rest HR: 81 Stress HR: 86  Rest BP: 148/87 Stress BP: 131/82  Exercise Time (min): n/a METS: n/a           Dose of Adenosine (mg):  n/a Dose of Lexiscan: 0.4 mg  Dose of Atropine (mg): n/a Dose of Dobutamine: n/a mcg/kg/min (at max HR)  Stress Test Technologist: Nelson ChimesSharon Brooks, BS-ES  Nuclear Technologist:  Domenic PoliteStephen Carbone, CNMT     Rest Procedure:  Myocardial perfusion imaging was performed at rest 45 minutes following the intravenous administration of Technetium 6067m Sestamibi. Rest ECG: Atrial Fibrilliation and RBBB  Stress Procedure:  The patient received IV Lexiscan 0.4 mg over 15-seconds.  Technetium 1567m Sestamibi injected  at 30-seconds.  Quantitative spect images were obtained after a 45 minute delay.  During the infusion of Lexiscan, the patient complained of slight SOB and flushing.  These symptoms resolved in recovery.  Stress ECG: No significant change from baseline ECG  QPS Raw Data Images:  Normal; no motion artifact; normal heart/lung ratio. Stress Images:  There is decreased uptake in the apex. Rest Images:  Comparison with the stress images reveals no significant change. Subtraction (SDS):  There is a fixed defect that is most consistent with a previous infarction. Transient Ischemic Dilatation (Normal <1.22):  1.09 Lung/Heart Ratio (Normal <0.45):  0.37  Quantitative Gated Spect Images QGS EDV:  n/a QGS ESV:  n/a  Impression Exercise Capacity:  Lexiscan with no exercise. BP Response:  Normal blood pressure response. Clinical Symptoms:  No significant symptoms noted. ECG Impression:  No significant ST segment change suggestive of ischemia. Comparison with Prior Nuclear Study: No previous nuclear study performed  Overall Impression:  Low risk stress nuclear study with a fixed apical infarction, but no reversible defects.  LV Ejection Fraction: Study not gated.  LV Wall Motion:  Study not gated  Thurmon FairMihai Luke Falero, MD, [facility] [phone] office [phone] pager

## 2013-08-21 ENCOUNTER — Ambulatory Visit (INDEPENDENT_AMBULATORY_CARE_PROVIDER_SITE_OTHER)
Admission: RE | Admit: 2013-08-21 | Discharge: 2013-08-21 | Disposition: A | Discharge status: Home / Self Care | Payer: Medicare Other | Source: Ambulatory Visit | Attending: Internal Medicine | Admitting: Internal Medicine

## 2013-08-21 DIAGNOSIS — R911 Solitary pulmonary nodule: Secondary | ICD-10-CM

## 2013-08-29 ENCOUNTER — Telehealth: Payer: Self-pay | Admitting: Internal Medicine

## 2013-08-29 NOTE — Telephone Encounter (Signed)
Please let Jordan Blackwell know that CT chest is stable fibrosis and nodule but he needs to have his 3 months full PFT and come in and see me to discuss IPF Rx. At last OV in feb 2015 I discussed the 2 drugs with him and ordered PFT /CT for 3 months. Of these only CT done. He needs to have his PFT and see me. First available is fine as long as < 1 month  Thanks  Dr. Kalman Shan, M.D., [facility].C.P Pulmonary and Critical Care Medicine Staff Physician [facility] [place] [facility] Pager: [phone], If no answer or between  15:00h - 7:00h: call [phone]  08/29/2013 4:34 AM

## 2013-08-30 NOTE — Telephone Encounter (Signed)
Called and spoke to pt. Pt was scheduled for full pft on 6/24 and f/u with MR on 6/29. Pt verbalized understanding and confirmed apt time and date and denied any further questions or concerns at this time.

## 2013-08-31 ENCOUNTER — Other Ambulatory Visit: Payer: Self-pay | Admitting: Cardiology

## 2013-09-05 ENCOUNTER — Ambulatory Visit (INDEPENDENT_AMBULATORY_CARE_PROVIDER_SITE_OTHER): Payer: Medicare Other | Admitting: Pharmacist Clinician (PhC)/ Clinical Pharmacy Specialist

## 2013-09-05 DIAGNOSIS — I4891 Unspecified atrial fibrillation: Secondary | ICD-10-CM

## 2013-09-05 DIAGNOSIS — Z5181 Encounter for therapeutic drug level monitoring: Secondary | ICD-10-CM

## 2013-09-05 LAB — POCT INR: INR: 3.3

## 2013-09-05 MED ORDER — WARFARIN SODIUM 5 MG PO TABS: ORAL_TABLET | ORAL | Status: DC

## 2013-09-05 MED ORDER — DILTIAZEM HCL ER COATED BEADS 120 MG PO CP24: 120.0000 mg | ORAL_CAPSULE | Freq: Every day | ORAL | Status: DC

## 2013-09-05 MED ORDER — METOPROLOL SUCCINATE ER 25 MG PO TB24: 25.0000 mg | ORAL_TABLET | Freq: Every day | ORAL | Status: DC

## 2013-09-20 ENCOUNTER — Ambulatory Visit: Payer: Medicare Other | Admitting: Pharmacist Clinician (PhC)/ Clinical Pharmacy Specialist

## 2013-09-23 ENCOUNTER — Ambulatory Visit (INDEPENDENT_AMBULATORY_CARE_PROVIDER_SITE_OTHER): Payer: Medicare Other | Admitting: Pharmacist Clinician (PhC)/ Clinical Pharmacy Specialist

## 2013-09-23 DIAGNOSIS — Z5181 Encounter for therapeutic drug level monitoring: Secondary | ICD-10-CM

## 2013-09-23 DIAGNOSIS — I4891 Unspecified atrial fibrillation: Secondary | ICD-10-CM

## 2013-09-23 LAB — POCT INR: INR: 2.6

## 2013-09-25 ENCOUNTER — Ambulatory Visit (INDEPENDENT_AMBULATORY_CARE_PROVIDER_SITE_OTHER): Payer: Medicare Other | Admitting: Internal Medicine

## 2013-09-25 DIAGNOSIS — J84112 Idiopathic pulmonary fibrosis: Secondary | ICD-10-CM

## 2013-09-25 LAB — PULMONARY FUNCTION TEST
DL/VA % pred: 82 %
DL/VA: 3.56 ml/min/mmHg/L
DLCO unc % pred: 26 %
DLCO unc: 7.12 ml/min/mmHg
FEF 25-75 Post: 1.52 L/sec
FEF 25-75 Pre: 1.59 L/sec
FEF2575-%CHANGE-POST: -4 %
FEF2575-%PRED-POST: 100 %
FEF2575-%Pred-Pre: 104 %
FEV1-%Change-Post: -3 %
FEV1-%PRED-POST: 49 %
FEV1-%Pred-Pre: 51 %
FEV1-POST: 1.14 L
FEV1-PRE: 1.18 L
FEV1FVC-%CHANGE-POST: -5 %
FEV1FVC-%Pred-Pre: 123 %
FEV6-%Change-Post: 1 %
FEV6-%Pred-Post: 45 %
FEV6-%Pred-Pre: 44 %
FEV6-Post: 1.37 L
FEV6-Pre: 1.35 L
FEV6FVC-%Pred-Post: 108 %
FEV6FVC-%Pred-Pre: 108 %
FVC-%CHANGE-POST: 1 %
FVC-%Pred-Post: 41 %
FVC-%Pred-Pre: 41 %
FVC-Post: 1.37 L
FVC-Pre: 1.35 L
PRE FEV6/FVC RATIO: 100 %
Post FEV1/FVC ratio: 83 %
Post FEV6/FVC ratio: 100 %
Pre FEV1/FVC ratio: 88 %
RV % pred: 31 %
RV: 0.79 L
TLC % pred: 36 %
TLC: 2.28 L

## 2013-09-25 NOTE — Progress Notes (Signed)
PFT done today. 

## 2013-09-30 ENCOUNTER — Ambulatory Visit (INDEPENDENT_AMBULATORY_CARE_PROVIDER_SITE_OTHER): Payer: Medicare Other | Admitting: Internal Medicine

## 2013-09-30 ENCOUNTER — Encounter: Payer: Self-pay | Admitting: Internal Medicine

## 2013-09-30 VITALS — BP 128/84 | HR 90 | Ht 67.0 in | Wt 127.0 lb

## 2013-09-30 DIAGNOSIS — R911 Solitary pulmonary nodule: Secondary | ICD-10-CM

## 2013-09-30 DIAGNOSIS — J84112 Idiopathic pulmonary fibrosis: Secondary | ICD-10-CM

## 2013-09-30 DIAGNOSIS — I2510 Atherosclerotic heart disease of native coronary artery without angina pectoris: Secondary | ICD-10-CM

## 2013-09-30 NOTE — Patient Instructions (Addendum)
#  IPF WE discussed you as potential candidate for therapy. Here are details There are 2 drugs OFEV and ESBRIET You are not a candidate for OFEV due to heart problems but are for ESBRIET Esbriet only slow down progression, 1 out of 6 patients  - this means extension in quality of life but no difference in symptoms  - 2-3 pill three times daily, slow up-titration. Need to wean sunscreen, small chance of nausea and anorexia but no diarrhea, no heart attack risk, no bleeding risk,   - need monthly blood work for 6 months  - possible mortality benefit in pooled analysis - larger world wide experience  Respect your decision to hold off  #Lung nodule RUL - 9mm Jan and May 2015  - repeat CT chest wo contrast May 2016  #Followup  6 months - office spiroemtry at followup

## 2013-09-30 NOTE — Progress Notes (Signed)
Subjective:    Patient ID: Jordan Blackwell, male    DOB: Jun 13, 1929, 80 y.o.   MRN: [id number]  HPI   Hospital Consult 04/10/13: 80 y/o M with PMH of CAD s/p CABG, HTN, MVR on chronic anticoagulation, CVA and afib who [facility] with complaints of chest pain and progressive shortness of breath. He was evaluated and found to be in afib with RVR. Patient was treated with NTG, lasix & cardizem. Patient admitted for rate control and further evaluation per Cardiology. ECHO was evaluated which demonstrated an EF 50-55%, nml wall motion, RV mod-severe dilated, RA severely dilated, PA Peak 34 mm Hg. Cardiac enzymes neg x3. Hospital course noted mildly soft BP's with lightheadedness and intermittent pauses. Despite diuresis, he continued to have dry crackles on exam and was evaluated with a high resolution CT of chest which was concerning for ILD / UIP. PCCM consulted for further evaluation.    At baseline, patient is very active, continues to mow yard, ambulates without assistance, lives alone and manages a 5 br home with yard work. He has had progressive mild SOB since summer 2014 but specific worsening since Thanksgiving 2014. He noted that he had more difficulty walking 200 yards than usual. Denies known illness, fevers,chills, sputum production, cough. Reports generalized weakness / decreased activity tolerance since summer. Noted >20 lb weight loss.    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 Wife smoked 3ppd in home & car, died of small cell lung ca  Pets: none  Medications: never on amiodarone    ASSESSMENT Age > 60, Classic UIP Ct, negative physical exam for autoimmune disease, Male sex = High clinical pre-test prob for IPF. No need for lung biopsy . DIagnosed in hosptial  04/10/13  OV 05/23/2013  Chief Complaint  Patient presents with  . Follow-up    HFU-States breathing has been the same since hospital d/c. SOB with activity. Denies CP, chest tightness, cough. Pt states he could hear "gurgles" deep in his chest and "the sounds goes away with O2".   Presents with daughter Jordan Blackwell  Now 05/23/2013 much improved. Here to discuss IPF Rx decisions. Has class 2-3 DOE relieved by rest. Feeling well. Here with [name] daughter. Lot of questions; he values his quality of life. HE is risk averse with medications. HE feels he only has few to several years left to live and wants to factor this in context of IPF drug Rx.   Wak test desaturated to 87% on 185 feet x 2nd lap on RA   OV 09/30/2013  Chief Complaint  Patient presents with  . Follow-up    Here to review PFT from 6/24. Pt states his breathing is doing well. Pt states he has occassional SOB. Denies cough and CP.    Pulmonary function test 09/25/2013 shows FVC of 1.35/41% predicted. FEV1 of 1.2 L or 51% predicted and a ratio of 80. No bronchodilator response. Total lung capacity of 2.3 L/36% predicted. Diffusion capacity of 7.12/26%. Overall consistent with severe IPF. We discussed Rx in detail to prevent progression of IPF and he absolutely is not keen on it  CT may 2015: 9 mm RUL nodule is stable. ILD is unchangedd  He feels stable. Class  2 doe without  change  Review of Systems  Constitutional: Negative for fever and unexpected weight change.  HENT: Positive for rhinorrhea and trouble swallowing. Negative for congestion, dental problem, ear pain, nosebleeds, postnasal drip, sinus pressure, sneezing and sore throat.   Eyes: Negative for redness and itching.  Respiratory: Positive for shortness of breath. Negative for cough, chest tightness and wheezing.   Cardiovascular: Negative for palpitations and leg swelling.  Gastrointestinal: Negative for nausea and vomiting.  Genitourinary: Negative for dysuria.    Musculoskeletal: Negative for joint swelling.  Skin: Negative for rash.  Neurological: Negative for headaches.  Hematological: Does not bruise/bleed easily.  Psychiatric/Behavioral: Negative for dysphoric mood. The patient is not nervous/anxious.        Objective:   Physical Exam  Filed Vitals:   09/30/13 1520  BP: 128/84  Pulse: 90  Height: 5\' 7"  (1.702 m)  Weight: 127 lb (57.607 kg)  SpO2: 92%    Nursing note and vitals reviewed. Constitutional: He is oriented to person, place, and time. He appears well-developed and well-nourished. No distress.  Looks well \\HENT :  Head: Normocephalic and atraumatic.  Right Ear: External ear normal.  Left Ear: External ear normal.  Mouth/Throat: Oropharynx is clear and moist. No oropharyngeal exudate.  Eyes: Conjunctivae and EOM are normal. Pupils are equal, round, and reactive to light. Right eye exhibits no discharge. Left eye exhibits no discharge. No scleral icterus.  Neck: Normal range of motion. Neck supple. No JVD present. No tracheal deviation present. No thyromegaly present.  Cardiovascular: Normal rate, regular rhythm and intact distal pulses.  Exam reveals no gallop and no friction rub.   No murmur heard. Pulmonary/Chest: Effort normal. No respiratory distress. He has no wheezes. He has rales. He exhibits no tenderness.  Bilateral crackles  Abdominal: Soft. Bowel sounds are normal. He exhibits no distension and no mass. There is no tenderness. There is no rebound and no guarding.  Musculoskeletal: Normal range of motion. He exhibits no edema and no tenderness.  Lymphadenopathy:    He has no cervical adenopathy.  Neurological: He is alert and oriented to person, place, and time. He has normal reflexes. No cranial nerve deficit. Coordination normal.  Skin: Skin is warm and dry. No rash noted. He is not diaphoretic. No erythema. No pallor.  Psychiatric: He has a normal mood and affect. His behavior is normal. Judgment and thought  content normal.           Assessment & Plan:  #IPF WE discussed you as potential candidate for therapy. Here are details There are 2 drugs OFEV and ESBRIET You are not a candidate for OFEV due to heart problems but are for ESBRIET Esbriet only slow down progression, 1 out of 6 patients  - this means extension in quality of life but no difference in symptoms  - 2-3 pill three times daily, slow up-titration. Need to wean sunscreen, small chance of nausea and anorexia but no diarrhea, no heart attack risk, no bleeding risk,   - need monthly blood work for 6 months  - possible mortality benefit in pooled analysis - larger world wide experience  Respect your decision to hold off  #Lung nodule RUL - 9mm Jan and May 2015  - repeat CT chest wo contrast May 2016  #Followup  6 months - office spiroemtry at followup

## 2013-10-05 NOTE — Assessment & Plan Note (Signed)
#  IPF WE discussed you as potential candidate for therapy. Here are details There are 2 drugs OFEV and ESBRIET You are not a candidate for OFEV due to heart problems but are for ESBRIET Esbriet only slow down progression, 1 out of 6 patients  - this means extension in quality of life but no difference in symptoms  - 2-3 pill three times daily, slow up-titration. Need to wean sunscreen, small chance of nausea and anorexia but no diarrhea, no heart attack risk, no bleeding risk,   - need monthly blood work for 6 months  - possible mortality benefit in pooled analysis - larger world wide experience  Respect your decision to hold off  #Followup  6 months - office spiroemtry at followup

## 2013-10-05 NOTE — Assessment & Plan Note (Signed)
#  Lung nodule RUL - 9mm Jan and May 2015  - repeat CT chest wo contrast May 2016

## 2013-10-23 ENCOUNTER — Ambulatory Visit (INDEPENDENT_AMBULATORY_CARE_PROVIDER_SITE_OTHER): Payer: Medicare Other | Admitting: Pharmacist Clinician (PhC)/ Clinical Pharmacy Specialist

## 2013-10-23 DIAGNOSIS — I4891 Unspecified atrial fibrillation: Secondary | ICD-10-CM

## 2013-10-23 DIAGNOSIS — Z5181 Encounter for therapeutic drug level monitoring: Secondary | ICD-10-CM

## 2013-10-23 LAB — POCT INR: INR: 2.9

## 2013-11-01 ENCOUNTER — Telehealth (HOSPITAL_COMMUNITY): Payer: Self-pay

## 2013-11-01 NOTE — Telephone Encounter (Signed)
Called patient to inquire about Pulmonary Rehab.  Patient states that he is not interested at this time.

## 2013-11-20 ENCOUNTER — Ambulatory Visit (INDEPENDENT_AMBULATORY_CARE_PROVIDER_SITE_OTHER): Payer: Medicare Other | Admitting: Pharmacist Clinician (PhC)/ Clinical Pharmacy Specialist

## 2013-11-20 DIAGNOSIS — I4891 Unspecified atrial fibrillation: Secondary | ICD-10-CM

## 2013-11-20 DIAGNOSIS — Z5181 Encounter for therapeutic drug level monitoring: Secondary | ICD-10-CM

## 2013-11-20 LAB — POCT INR: INR: 2.9

## 2013-11-20 MED ORDER — WARFARIN SODIUM 5 MG PO TABS: ORAL_TABLET | ORAL | Status: DC

## 2013-11-20 MED ORDER — PANTOPRAZOLE SODIUM 40 MG PO TBEC: 40.0000 mg | DELAYED_RELEASE_TABLET | Freq: Every day | ORAL | Status: DC

## 2013-12-18 ENCOUNTER — Ambulatory Visit (INDEPENDENT_AMBULATORY_CARE_PROVIDER_SITE_OTHER): Payer: Medicare Other | Admitting: Pharmacist Clinician (PhC)/ Clinical Pharmacy Specialist

## 2013-12-18 DIAGNOSIS — I4891 Unspecified atrial fibrillation: Secondary | ICD-10-CM

## 2013-12-18 DIAGNOSIS — Z5181 Encounter for therapeutic drug level monitoring: Secondary | ICD-10-CM

## 2013-12-18 LAB — POCT INR: INR: 2.3

## 2014-01-15 ENCOUNTER — Ambulatory Visit (INDEPENDENT_AMBULATORY_CARE_PROVIDER_SITE_OTHER): Payer: Medicare Other | Admitting: Pharmacist Clinician (PhC)/ Clinical Pharmacy Specialist

## 2014-01-15 DIAGNOSIS — I4891 Unspecified atrial fibrillation: Secondary | ICD-10-CM

## 2014-01-15 DIAGNOSIS — Z5181 Encounter for therapeutic drug level monitoring: Secondary | ICD-10-CM

## 2014-01-15 LAB — POCT INR: INR: 2

## 2014-02-05 ENCOUNTER — Encounter: Payer: Self-pay | Admitting: Cardiology

## 2014-02-05 ENCOUNTER — Ambulatory Visit (INDEPENDENT_AMBULATORY_CARE_PROVIDER_SITE_OTHER): Payer: Medicare Other | Admitting: Cardiology

## 2014-02-05 VITALS — BP 139/72 | HR 93 | Ht 67.0 in | Wt 120.0 lb

## 2014-02-05 DIAGNOSIS — J84112 Idiopathic pulmonary fibrosis: Secondary | ICD-10-CM

## 2014-02-05 DIAGNOSIS — Z9889 Other specified postprocedural states: Secondary | ICD-10-CM

## 2014-02-05 DIAGNOSIS — I482 Chronic atrial fibrillation, unspecified: Secondary | ICD-10-CM

## 2014-02-05 DIAGNOSIS — I10 Essential (primary) hypertension: Secondary | ICD-10-CM

## 2014-02-05 DIAGNOSIS — I2510 Atherosclerotic heart disease of native coronary artery without angina pectoris: Secondary | ICD-10-CM

## 2014-02-05 DIAGNOSIS — I4891 Unspecified atrial fibrillation: Secondary | ICD-10-CM

## 2014-02-05 DIAGNOSIS — I2583 Coronary atherosclerosis due to lipid rich plaque: Secondary | ICD-10-CM

## 2014-02-05 NOTE — Progress Notes (Signed)
HPI: Followup atrial fibrillation. Patient is status post coronary artery bypass graft in 1993 and mitral valve repair in 2003. Abdominal CT in December 2012 showed no aneurysm. Admitted with atrial fibrillation with rapid ventricular response in January 2015. Echocardiogram in January 2015 showed normal LV function, severe biatrial enlargement, moderate tricuspid regurgitation and mild aortic insufficiency. High-resolution chest CT showed interstitial lung disease (UIP). There was a nodule in the right upper lobe. The pulmonary artery was dilated. Patient was treated with rate controlling medications and Coumadin. Monitor in January of 2015 showed atrial fibrillation with adequate rate control. Nuclear study May 2015 showed apical infarct but no ischemia. Study not gated. Since he was last seen, He does have some dyspnea on exertion because of lung disease. No orthopnea, PND, pedal edema, chest pain or syncope.  Current Outpatient Prescriptions  Medication Sig Dispense Refill  . diltiazem (CARDIZEM CD) 120 MG 24 hr capsule Take 1 capsule (120 mg total) by mouth daily. 90 capsule 2  . metoprolol succinate (TOPROL-XL) 25 MG 24 hr tablet Take 1 tablet (25 mg total) by mouth daily. 90 tablet 2  . nitroGLYCERIN (NITROSTAT) 0.4 MG SL tablet Place 1 tablet (0.4 mg total) under the tongue every 5 (five) minutes as needed for chest pain. 25 tablet 2  . pantoprazole (PROTONIX) 40 MG tablet Take 1 tablet (40 mg total) by mouth daily. 90 tablet 1  . warfarin (COUMADIN) 5 MG tablet Take 1 tablet by mouth daily or as directed by coumadin clinic 90 tablet 1  . acetaminophen (TYLENOL) 325 MG tablet Take 2 tablets (650 mg total) by mouth every 4 (four) hours as needed for headache or mild pain.     No current facility-administered medications for this visit.     Past Medical History  Diagnosis Date  . Coronary artery disease   . Stroke   . Atrial fibrillation   . Atrial thrombus   . Hypertension   .  B12 deficiency   . Folic acid deficiency   . Mitral valve regurgitation     Past Surgical History  Procedure Laterality Date  . Inguinal hernia repair    . Coronary artery bypass graft      1993  . Mitral valve anuloplasty      2003 [facility]  . Cholecystectomy    . Ankle fracture surgery    . Appendectomy    . Abdominal adhesion surgery    . Esophageal dilatation    . Cataract extraction    . Mitral valve repair N/A 2003    History   Social History  . Marital Status: Widowed    Spouse Name: N/A    Number of Children: 1  . Years of Education: N/A   Occupational History  . Not on file.   Social History Main Topics  . Smoking status: Former Smoker    Types: Cigarettes  . Smokeless tobacco: Never Used     Comment: Quit 50 years ago.  . Alcohol Use: No  . Drug Use: No  . Sexual Activity: No   Other Topics Concern  . Not on file   Social History Narrative   Lives alone.          ROS: no fevers or chills, productive cough, hemoptysis, dysphasia, odynophagia, melena, hematochezia, dysuria, hematuria, rash, seizure activity, orthopnea, PND, pedal edema, claudication. Remaining systems are negative.  Physical Exam: Well-developed well-nourished in no acute distress.  Skin is warm and dry.  HEENT is normal.  Neck is supple.  Chest Diffuse dry crackles Cardiovascular exam is irregular, 2/6 diastolic murmur Abdominal exam nontender or distended. No masses palpated. Extremities show no edema. neuro grossly intact  ECG Atrial fibrillation at a rate of 93. Occasional PVCs or aberrantly conducted beats. Right bundle branch block.

## 2014-02-05 NOTE — Assessment & Plan Note (Signed)
Blood pressure controlled. Continue present medications. 

## 2014-02-05 NOTE — Patient Instructions (Signed)
Your physician wants you to follow-up in: ONE YEAR WITH DR CRENSHAW You will receive a reminder letter in the mail two months in advance. If you don't receive a letter, please call our office to schedule the follow-up appointment.  

## 2014-02-05 NOTE — Assessment & Plan Note (Signed)
Not on aspirin given need for Coumadin. Intolerant to statins. 

## 2014-02-05 NOTE — Assessment & Plan Note (Signed)
Continue Cardizem and Toprol for rate control. Continue Coumadin.

## 2014-02-05 NOTE — Assessment & Plan Note (Signed)
Followed by pulmonary 

## 2014-02-05 NOTE — Assessment & Plan Note (Signed)
Continue SBE prophylaxis. 

## 2014-02-12 ENCOUNTER — Ambulatory Visit (INDEPENDENT_AMBULATORY_CARE_PROVIDER_SITE_OTHER): Payer: Medicare Other | Admitting: Pharmacist Clinician (PhC)/ Clinical Pharmacy Specialist

## 2014-02-12 DIAGNOSIS — Z5181 Encounter for therapeutic drug level monitoring: Secondary | ICD-10-CM

## 2014-02-12 DIAGNOSIS — I4891 Unspecified atrial fibrillation: Secondary | ICD-10-CM

## 2014-02-12 LAB — POCT INR: INR: 2.1

## 2014-03-07 ENCOUNTER — Ambulatory Visit (INDEPENDENT_AMBULATORY_CARE_PROVIDER_SITE_OTHER): Payer: Medicare Other | Admitting: Internal Medicine

## 2014-03-07 ENCOUNTER — Encounter: Payer: Self-pay | Admitting: Internal Medicine

## 2014-03-07 VITALS — BP 130/62 | HR 61 | Ht 67.0 in | Wt 125.0 lb

## 2014-03-07 DIAGNOSIS — J84112 Idiopathic pulmonary fibrosis: Secondary | ICD-10-CM

## 2014-03-07 DIAGNOSIS — I2510 Atherosclerotic heart disease of native coronary artery without angina pectoris: Secondary | ICD-10-CM

## 2014-03-07 DIAGNOSIS — Z23 Encounter for immunization: Secondary | ICD-10-CM

## 2014-03-07 NOTE — Patient Instructions (Addendum)
#  IPF - clinically stable - Continue to respect your decision to hold off on esbriet - FLu shot 03/07/2014  - USe o2 as needed and with exertion and at night   #Lung nodule RUL - 9mm Jan and May 2015  - repeat CT chest wo contrast May 2016  #Followup  6 months -walk test in office (not 6mwd) at followup

## 2014-03-07 NOTE — Progress Notes (Signed)
Subjective:    Patient ID: Jordan Blackwell, male    DOB: [birth year], 80 y.o.   MRN: [id number]  HPI  Hospital Consult 04/10/13: 80 y/o M with PMH of CAD s/p CABG, HTN, MVR on chronic anticoagulation, CVA and afib who presented to [facility] ER on 1/3 with complaints of chest pain and progressive shortness of breath. He was evaluated and found to be in afib with RVR. Patient was treated with NTG, lasix & cardizem. Patient admitted for rate control and further evaluation per Cardiology. ECHO was evaluated which demonstrated an EF 50-55%, nml wall motion, RV mod-severe dilated, RA severely dilated, PA Peak 34 mm Hg. Cardiac enzymes neg x3. Hospital course noted mildly soft BP's with lightheadedness and intermittent pauses. Despite diuresis, he continued to have dry crackles on exam and was evaluated with a high resolution CT of chest which was concerning for ILD / UIP. PCCM consulted for further evaluation.    At baseline, patient is very active, continues to mow yard, ambulates without assistance, lives alone and manages a 5 br home with yard work. He has had progressive mild SOB since summer 2014 but specific worsening since Thanksgiving 2014. He noted that he had more difficulty walking 200 yards than usual. Denies known illness, fevers,chills, sputum production, cough. Reports generalized weakness / decreased activity tolerance since summer. Noted >20 lb weight loss.    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 Wife smoked 3ppd in home & car, died of small cell lung ca  Pets: none  Medications: never on amiodarone    ASSESSMENT Age > 60, Classic UIP Ct, negative physical exam for autoimmune disease, Male sex = High clinical pre-test prob for IPF. No need for lung biopsy . DIagnosed in hosptial  04/10/13  OV 05/23/2013  Chief Complaint  Patient presents with  . Follow-up    HFU-States breathing has been the same since hospital d/c. SOB with activity. Denies CP, chest tightness, cough. Pt states he could hear "gurgles" deep in his chest and "the sounds goes away with O2".   Presents with daughter [name]  Now 05/23/2013 much improved. Here to discuss IPF Rx decisions. Has class 2-3 DOE relieved by rest. Feeling well. Here with [name] daughter. Lot of questions; he values his quality of life. HE is risk averse with medications. HE feels he only has few to several years left to live and wants to factor this in context of IPF drug Rx.   Wak test desaturated to 87% on 185 feet x 2nd lap on RA   OV 09/30/2013  Chief Complaint  Patient presents with  . Follow-up    Here to review PFT from 6/24. Pt states his breathing is doing well. Pt states he has occassional SOB. Denies cough and CP.    Pulmonary function test 09/25/2013 shows FVC of 1.35/41% predicted. FEV1 of 1.2 L or 51% predicted and a ratio of 80. No bronchodilator response. Total lung capacity of 2.3 L/36% predicted. Diffusion capacity of 7.12/26%. Overall consistent with severe IPF. We discussed Rx in detail to prevent progression of IPF and he absolutely is not keen on it  CT may 2015: 9 mm RUL nodule is stable. ILD is unchangedd  He feels stable. Class  2 doe without change  OV 03/07/2014  Chief Complaint  Patient presents with  . Follow-up    Pt states his breathing is unchanged since last OV. Pt c/o PND, prod cough with clear mucus and chest discomfort when active-CP resolves with rest.     FU severe IPF - palliative approach. S/p refusal of specific IPF Rx. Not a OFEV candidate due to anticoagulation  Spirometry today FVC 1.3 L/34%, FEV1 1.05 L/37% ratio 82/108%. This is similar to before as in June 2015.  HE continues to refuse Esbriet. He prefers palliative approach. He needs qualifying sats by [facility].  HE feels stable. No worsening. Has class 2-3 dyspnea on exertion relieved by rest and prn o2. He has mild 3/10 chronic cough with his IPF but feels this is stable too. No new issues. Walk test 185 feet x 3 laps on RA: and desaturated to 84%  Psat hx: 3 weeks ago went to [facility] and got flulike illness and now recovered.   Immunization History  Administered Date(s) Administered  . Influenza,inj,Quad PF,36+ Mos 04/07/2013     Review of Systems  Constitutional: Negative for fever and unexpected weight change.  HENT: Positive for postnasal drip. Negative for congestion, dental problem, ear pain, nosebleeds, rhinorrhea, sinus pressure, sneezing, sore throat and trouble swallowing.   Eyes: Negative for redness and itching.  Respiratory: Positive for cough, chest tightness and shortness of breath. Negative for wheezing.   Cardiovascular: Negative for palpitations and leg swelling.  Gastrointestinal: Negative for nausea and vomiting.  Genitourinary: Negative for dysuria.  Musculoskeletal: Negative for joint swelling.  Skin: Negative for rash.  Neurological: Negative for headaches.  Hematological: Does not bruise/bleed easily.  Psychiatric/Behavioral: Negative for dysphoric mood. The patient is not nervous/anxious.        Objective:   Physical Exam  Filed Vitals:   03/07/14 1238  BP: 130/62  Pulse: 61  Height: 5\' 7"  (1.702 m)  Weight: 125 lb (56.7 kg)  SpO2: 92%     Nursing note and vitals reviewed. Constitutional: He is oriented to person, place, and time. He appears well-developed and well-nourished. No distress.  Looks well \\HENT :  Head: Normocephalic and atraumatic.  Right Ear: External ear normal.  Left Ear: External ear normal.  Mouth/Throat: Oropharynx is clear and moist. No oropharyngeal exudate.  Eyes: Conjunctivae and EOM are normal. Pupils are equal, round, and reactive to light. Right eye exhibits no discharge. Left eye exhibits no discharge. No scleral  icterus.  Neck: Normal range of motion. Neck supple. No JVD present. No tracheal deviation present. No thyromegaly present.  Cardiovascular: Normal rate, regular rhythm and intact distal pulses.  Exam reveals no gallop and no friction rub.   No murmur heard. Pulmonary/Chest: Effort normal. No respiratory distress. He has no wheezes. He has rales. He exhibits no tenderness.  Bilateral crackles  Abdominal: Soft. Bowel sounds are normal. He exhibits no distension and no mass. There is no tenderness. There is no rebound and no guarding.  Musculoskeletal: Normal range of motion. He exhibits no edema and no tenderness.  Lymphadenopathy:    He has no cervical adenopathy.  Neurological: He is alert and oriented to person, place, and time. He has normal reflexes. No cranial nerve deficit. Coordination normal.  Skin: Skin is warm and dry. No rash noted. He is not diaphoretic. No erythema. No pallor.  Psychiatric: He has a normal mood and affect. His behavior is normal. Judgment and thought content normal.  Assessment & Plan:     ICD-9-CM ICD-10-CM   1. Need for prophylactic vaccination and inoculation against influenza V04.81 Z23   2. IPF (idiopathic pulmonary fibrosis) 516.31 J84.112 Spirometry with graph    #IPF - clinically stable - Continue to respect your decision to hold off on esbriet - FLu shot 03/07/2014  - USe o2 as needed and with exertion and at night   #Lung nodule RUL - 9mm Jan and May 2015  - repeat CT chest wo contrast May 2016  #Followup  6 months -walk test in office (not 6mwd) at followup    (> 50% of this 15 min visit spent in face to face counseling or/and coordination of care)  Dr. Kalman ShanMurali Simeon Vera, M.D., [facility].P Pulmonary and Critical Care Medicine Staff Physician [facility] Critical [facility] Pager: [phone], If no answer or between  15:00h - 7:00h: call [phone]  03/08/2014 2:36 AM

## 2014-03-12 ENCOUNTER — Ambulatory Visit (INDEPENDENT_AMBULATORY_CARE_PROVIDER_SITE_OTHER): Payer: Medicare Other | Admitting: Pharmacist Clinician (PhC)/ Clinical Pharmacy Specialist

## 2014-03-12 DIAGNOSIS — Z5181 Encounter for therapeutic drug level monitoring: Secondary | ICD-10-CM

## 2014-03-12 DIAGNOSIS — I4891 Unspecified atrial fibrillation: Secondary | ICD-10-CM

## 2014-03-12 LAB — POCT INR: INR: 2.6

## 2014-04-23 ENCOUNTER — Ambulatory Visit (INDEPENDENT_AMBULATORY_CARE_PROVIDER_SITE_OTHER): Payer: Medicare Other | Admitting: Pharmacist Clinician (PhC)/ Clinical Pharmacy Specialist

## 2014-04-23 DIAGNOSIS — I4891 Unspecified atrial fibrillation: Secondary | ICD-10-CM

## 2014-04-23 DIAGNOSIS — Z5181 Encounter for therapeutic drug level monitoring: Secondary | ICD-10-CM

## 2014-04-23 LAB — POCT INR: INR: 2.7

## 2014-04-25 ENCOUNTER — Other Ambulatory Visit: Payer: Self-pay | Admitting: Pharmacist Clinician (PhC)/ Clinical Pharmacy Specialist

## 2014-04-25 MED ORDER — WARFARIN SODIUM 5 MG PO TABS: ORAL_TABLET | ORAL | Status: DC

## 2014-05-16 ENCOUNTER — Other Ambulatory Visit: Payer: Self-pay | Admitting: Cardiology

## 2014-05-16 MED ORDER — PANTOPRAZOLE SODIUM 40 MG PO TBEC: 40.0000 mg | DELAYED_RELEASE_TABLET | Freq: Every day | ORAL | Status: DC

## 2014-06-04 ENCOUNTER — Ambulatory Visit (INDEPENDENT_AMBULATORY_CARE_PROVIDER_SITE_OTHER): Payer: Medicare Other | Admitting: Pharmacist Clinician (PhC)/ Clinical Pharmacy Specialist

## 2014-06-04 DIAGNOSIS — I4891 Unspecified atrial fibrillation: Secondary | ICD-10-CM

## 2014-06-04 DIAGNOSIS — Z5181 Encounter for therapeutic drug level monitoring: Secondary | ICD-10-CM

## 2014-06-04 LAB — POCT INR: INR: 2.1

## 2014-06-11 ENCOUNTER — Other Ambulatory Visit: Payer: Self-pay

## 2014-06-11 MED ORDER — METOPROLOL SUCCINATE ER 25 MG PO TB24: 25.0000 mg | ORAL_TABLET | Freq: Every day | ORAL | Status: DC

## 2014-06-11 MED ORDER — DILTIAZEM HCL ER COATED BEADS 120 MG PO CP24: 120.0000 mg | ORAL_CAPSULE | Freq: Every day | ORAL | Status: DC

## 2014-07-16 ENCOUNTER — Ambulatory Visit (INDEPENDENT_AMBULATORY_CARE_PROVIDER_SITE_OTHER): Payer: Medicare Other | Admitting: Pharmacist Clinician (PhC)/ Clinical Pharmacy Specialist

## 2014-07-16 DIAGNOSIS — Z5181 Encounter for therapeutic drug level monitoring: Secondary | ICD-10-CM | POA: Diagnosis not present

## 2014-07-16 DIAGNOSIS — I4891 Unspecified atrial fibrillation: Secondary | ICD-10-CM | POA: Diagnosis not present

## 2014-07-16 LAB — POCT INR: INR: 1.9

## 2014-08-04 ENCOUNTER — Telehealth: Payer: Self-pay | Admitting: Internal Medicine

## 2014-08-04 NOTE — Telephone Encounter (Signed)
Per 03/07/14 OV note: #IPF - clinically stable - Continue to respect your decision to hold off on esbriet - FLu shot 03/07/2014  - USe o2 as needed and with exertion and at night #Lung nodule RUL - 9mm Jan and May 2015  - repeat CT chest wo contrast May 2016 #Followup  6 months -walk test in office (not 6mwd) at followup ---  I called spoke with pt. He reports he did cancel his CT appt bc he did not know why he was having this done. I advised him it was to f/u his nodule. He wants to speak with PCC;s to see if this is covered and then get it r/s. Please advise thanks

## 2014-08-13 ENCOUNTER — Ambulatory Visit (INDEPENDENT_AMBULATORY_CARE_PROVIDER_SITE_OTHER): Payer: Medicare Other | Admitting: Pharmacist Clinician (PhC)/ Clinical Pharmacy Specialist

## 2014-08-13 DIAGNOSIS — Z5181 Encounter for therapeutic drug level monitoring: Secondary | ICD-10-CM | POA: Diagnosis not present

## 2014-08-13 DIAGNOSIS — I4891 Unspecified atrial fibrillation: Secondary | ICD-10-CM

## 2014-08-13 LAB — POCT INR: INR: 2.2

## 2014-08-14 ENCOUNTER — Inpatient Hospital Stay: Admission: RE | Admit: 2014-08-14 | Payer: Medicare Other | Source: Ambulatory Visit

## 2014-08-29 ENCOUNTER — Telehealth: Payer: Self-pay | Admitting: Cardiology

## 2014-08-29 NOTE — Telephone Encounter (Signed)
Closed encounter °

## 2014-09-07 ENCOUNTER — Other Ambulatory Visit: Payer: Self-pay | Admitting: Cardiology

## 2014-09-10 ENCOUNTER — Other Ambulatory Visit: Payer: Self-pay

## 2014-09-10 ENCOUNTER — Other Ambulatory Visit: Payer: Self-pay | Admitting: Cardiology

## 2014-09-10 MED ORDER — PANTOPRAZOLE SODIUM 40 MG PO TBEC: 40.0000 mg | DELAYED_RELEASE_TABLET | Freq: Every day | ORAL | Status: DC

## 2014-09-10 NOTE — Telephone Encounter (Signed)
pantoprazole (PROTONIX) 40 MG tablet 90 tablet 1 05/16/2014      Sig - Route: Take 1 tablet (40 mg total) by mouth daily. - Oral    E-Prescribing Status: Receipt confirmed by pharmacy (05/16/2014 1:10 PM EST)     Pharmacy    [id number] - [place], [place] - [address]

## 2014-09-23 ENCOUNTER — Ambulatory Visit (INDEPENDENT_AMBULATORY_CARE_PROVIDER_SITE_OTHER): Payer: Medicare Other | Admitting: Pharmacist

## 2014-09-23 DIAGNOSIS — Z5181 Encounter for therapeutic drug level monitoring: Secondary | ICD-10-CM

## 2014-09-23 DIAGNOSIS — I4891 Unspecified atrial fibrillation: Secondary | ICD-10-CM | POA: Diagnosis not present

## 2014-09-23 LAB — POCT INR: INR: 2.3

## 2014-10-20 ENCOUNTER — Other Ambulatory Visit: Payer: Self-pay | Admitting: Cardiology

## 2014-10-29 ENCOUNTER — Encounter: Payer: Self-pay | Admitting: Internal Medicine

## 2014-10-29 ENCOUNTER — Ambulatory Visit (INDEPENDENT_AMBULATORY_CARE_PROVIDER_SITE_OTHER): Payer: Medicare Other | Admitting: Internal Medicine

## 2014-10-29 VITALS — BP 132/78 | HR 72 | Ht 67.0 in | Wt 119.4 lb

## 2014-10-29 DIAGNOSIS — J84112 Idiopathic pulmonary fibrosis: Secondary | ICD-10-CM | POA: Diagnosis not present

## 2014-10-29 DIAGNOSIS — R911 Solitary pulmonary nodule: Secondary | ICD-10-CM

## 2014-10-29 DIAGNOSIS — Z23 Encounter for immunization: Secondary | ICD-10-CM

## 2014-10-29 NOTE — Progress Notes (Signed)
Subjective:    Patient ID: Jordan Blackwell, male    DOB: 15-Aug-1929, 81 y.o.   MRN: [id number]  HPI    Hospital Consult 04/10/13: 81 y/o M with PMH of CAD s/p CABG, HTN, MVR on chronic anticoagulation, CVA and afib who presented to [facility] ER on 1/3 with complaints of chest pain and progressive shortness of breath. He was evaluated and found to be in afib with RVR. Patient was treated with NTG, lasix & cardizem. Patient admitted for rate control and further evaluation per Cardiology. ECHO was evaluated which demonstrated an EF 50-55%, nml wall motion, RV mod-severe dilated, RA severely dilated, PA Peak 34 mm Hg. Cardiac enzymes neg x3. Hospital course noted mildly soft BP's with lightheadedness and intermittent pauses. Despite diuresis, he continued to have dry crackles on exam and was evaluated with a high resolution CT of chest which was concerning for ILD / UIP. PCCM consulted for further evaluation.    At baseline, patient is very active, continues to mow yard, ambulates without assistance, lives alone and manages a 5 br home with yard work. He has had progressive mild SOB since summer 2014 but specific worsening since Thanksgiving 2014. He noted that he had more difficulty walking 200 yards than usual. Denies known illness, fevers,chills, sputum production, cough. Reports generalized weakness / decreased activity tolerance since summer. Noted >20 lb weight loss.    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 Wife smoked 3ppd in home & car, died of small cell lung ca  Pets: none  Medications: never on amiodarone    ASSESSMENT Age > 60, Classic UIP Ct, negative physical exam for autoimmune disease, Male sex = High clinical pre-test prob for IPF. No need for lung biopsy . DIagnosed in hosptial  04/10/13  OV 05/23/2013  Chief Complaint  Patient presents with  . Follow-up    HFU-States breathing has been the same since hospital d/c. SOB with activity. Denies CP, chest tightness, cough. Pt states he could hear "gurgles" deep in his chest and "the sounds goes away with O2".   Presents with daughter [name]  Now 05/23/2013 much improved. Here to discuss IPF Rx decisions. Has class 2-3 DOE relieved by rest. Feeling well. Here with [name] daughter. Lot of questions; he values his quality of life. HE is risk averse with medications. HE feels he only has few to several years left to live and wants to factor this in context of IPF drug Rx.   Wak test desaturated to 87% on 185 feet x 2nd lap on RA   OV 09/30/2013  Chief Complaint  Patient presents with  . Follow-up    Here to review PFT from 6/24. Pt states his breathing is doing well. Pt states he has occassional SOB. Denies cough and CP.    Pulmonary function test 09/25/2013 shows FVC of 1.35/41% predicted. FEV1 of 1.2 L or 51% predicted and a ratio of 80. No bronchodilator response. Total lung capacity of 2.3 L/36% predicted. Diffusion capacity of 7.12/26%. Overall consistent with severe IPF. We discussed Rx in detail to prevent progression of IPF and he absolutely is not keen on it  CT may 2015: 9 mm RUL nodule is stable. ILD is unchangedd  He feels stable. Class  2 doe  without change    OV 03/07/2014  Chief Complaint  Patient presents with  . Follow-up    Pt states his breathing is unchanged since last OV. Pt c/o PND, prod cough with clear mucus and chest discomfort when active-CP resolves with rest.     FU severe IPF - palliative approach. S/p refusal of specific IPF Rx. Not a OFEV candidate due to anticoagulation  Spirometry today FVC 1.3 L/34%, FEV1 1.05 L/37% ratio 82/108%. This is similar to before as in June 2015.  HE continues to refuse Esbriet. He prefers palliative approach. He needs qualifying sats by [facility].  HE feels stable. No worsening. Has class 2-3 dyspnea on exertion relieved by rest and prn o2. He has mild 3/10 chronic cough with his IPF but feels this is stable too. No new issues. Walk test 185 feet x 3 laps on RA: and desaturated to 84%  Psat hx: 3 weeks ago went to [facility] and got flulike illness and now recovered.     OV 10/29/2014  Chief Complaint  Patient presents with  . Follow-up    Pt states his breathing is unchanged since last OV. Pt c/o PND and rhinorrhea. Pt denies cough and CP/tightness.    Follow-up   - Right upper lobe lung nodule 9 mm in January and May 2015: He is supposed to have a CT chest in May 2016 but he refused and canceled CT chest. We discussed that there is a low intermediate probably ready of lung cancer but he is not interested in knowing about it or treating it. Therefore is not interested in surveillance    - idiopathic pulmonary fibrosis - palliative approach. Status post refusal of specific IPF treatment. Not a OFEV candidate due to anticoagulation. Overall he reports stable shortness of breath. Walking desaturation test 185 feet 3 laps on room air : He only completed the second lap and he desaturated to 85% and this appears to be stable. He says that he is using his oxygen at night and occasionally with exertion as needed. He is feeling well. He is not interested again and specific IPF treatment. He understands this will slow down the disease but he says that he has lived well at age 34 and is not interested in treatment.   Immunization History  Administered Date(s) Administered  . Influenza,inj,Quad PF,36+ Mos 04/07/2013, 03/07/2014     Review of Systems  Constitutional: Negative for fever and unexpected weight change.  HENT: Positive for postnasal drip and rhinorrhea. Negative for congestion, dental problem, ear pain, nosebleeds, sinus pressure, sneezing, sore throat and trouble swallowing.   Eyes: Negative for redness and itching.    Respiratory: Negative for cough, chest tightness, shortness of breath and wheezing.   Cardiovascular: Negative for palpitations and leg swelling.  Gastrointestinal: Negative for nausea and vomiting.  Genitourinary: Negative for dysuria.  Musculoskeletal: Negative for joint swelling.  Skin: Negative for rash.  Neurological: Negative for headaches.  Hematological: Does not bruise/bleed easily.  Psychiatric/Behavioral: Negative for dysphoric mood. The patient is not nervous/anxious.      Current outpatient prescriptions:  .  acetaminophen (TYLENOL) 325 MG tablet, Take 2 tablets (650 mg total) by mouth every 4 (four) hours as needed for headache or mild pain., Disp: , Rfl:  .  diltiazem (CARDIZEM CD) 120 MG 24 hr capsule, Take 1 capsule (120 mg total) by mouth daily., Disp: 90 capsule, Rfl: 2 .  loratadine (CLARITIN) 10 MG tablet, Take 10 mg by mouth daily., Disp: ,  Rfl:  .  metoprolol succinate (TOPROL-XL) 25 MG 24 hr tablet, Take 1 tablet (25 mg total) by mouth daily., Disp: 90 tablet, Rfl: 2 .  nitroGLYCERIN (NITROSTAT) 0.4 MG SL tablet, Place 1 tablet (0.4 mg total) under the tongue every 5 (five) minutes as needed for chest pain., Disp: 25 tablet, Rfl: 2 .  pantoprazole (PROTONIX) 40 MG tablet, Take 1 tablet (40 mg total) by mouth daily., Disp: 90 tablet, Rfl: 1 .  warfarin (COUMADIN) 5 MG tablet, TAKE 1 TABLET BY MOUTH DAILY OR AS DIRECTED BY COUMADIN CLINIC, Disp: 90 tablet, Rfl: 1      Objective:   Physical Exam  Constitutional: He is oriented to person, place, and time. He appears well-developed and well-nourished. No distress.  Frail male looks well has a cane  HENT:  Head: Normocephalic and atraumatic.  Right Ear: External ear normal.  Left Ear: External ear normal.  Mouth/Throat: Oropharynx is clear and moist. No oropharyngeal exudate.  Eyes: Conjunctivae and EOM are normal. Pupils are equal, round, and reactive to light. Right eye exhibits no discharge. Left eye exhibits no  discharge. No scleral icterus.  Neck: Normal range of motion. Neck supple. No JVD present. No tracheal deviation present. No thyromegaly present.  Cardiovascular: Normal rate, regular rhythm and intact distal pulses.  Exam reveals no gallop and no friction rub.   No murmur heard. Pulmonary/Chest: Effort normal. No respiratory distress. He has no wheezes. He has rales. He exhibits no tenderness.  Abdominal: Soft. Bowel sounds are normal. He exhibits no distension and no mass. There is no tenderness. There is no rebound and no guarding.  Musculoskeletal: Normal range of motion. He exhibits no edema or tenderness.  Lymphadenopathy:    He has no cervical adenopathy.  Neurological: He is alert and oriented to person, place, and time. He has normal reflexes. No cranial nerve deficit. Coordination normal.  Skin: Skin is warm and dry. No rash noted. He is not diaphoretic. No erythema. No pallor.  Psychiatric: He has a normal mood and affect. His behavior is normal. Judgment and thought content normal.  Nursing note and vitals reviewed.   Ceasar Mons Vitals:   10/29/14 1347  BP: 132/78  Pulse: 72  Height: 5\' 7"  (1.702 m)  Weight: 119 lb 6.4 oz (54.159 kg)  SpO2: 94%         Assessment & Plan:     ICD-9-CM ICD-10-CM   1. IPF (idiopathic pulmonary fibrosis) 516.31 J84.112   2. Nodule of right lung 793.11 R91.1    #IPF - clinically stable - Continue to respect your decision to hold off on esbriet  - FLu shot fall 2016 - have prevnar vaccine 10/29/2014 - USe o2 as needed and with exertion and at night; as before   #Lung nodule RUL - 9mm Jan and May 2015  - resepect decision to hold off on CT chest May 2016 and beyond  #Followup  6 months -walk test in office (not ) at followup (> 50% of this 15 min visit spent in face to face counseling or/and coordination of care)    Dr. Kalman Shan, M.D., [facility].C.P Pulmonary and Critical Care Medicine Staff Physician   System [place] [facility]monary and Critical Care Pager: [phone], If no answer or between  15:00h - 7:00h: call [phone]  10/29/2014 2:17 PM

## 2014-10-29 NOTE — Addendum Note (Signed)
Addended by: Cammy Copa on: 10/29/2014 02:33 PM   Modules accepted: Orders

## 2014-10-29 NOTE — Patient Instructions (Addendum)
#  IPF - clinically stable - Continue to respect your decision to hold off on esbriet  - FLu shot fall 2016 - have prevnar vaccine 10/29/2014 - USe o2 as needed and with exertion and at night; as before   #Lung nodule RUL - 9mm Jan and May 2015  - resepect decision to hold off on CT chest May 2016 and beyond  #Followup  6 months -walk test in office (not ) at followup

## 2014-11-05 ENCOUNTER — Ambulatory Visit (INDEPENDENT_AMBULATORY_CARE_PROVIDER_SITE_OTHER): Payer: Medicare Other | Admitting: Pharmacist Clinician (PhC)/ Clinical Pharmacy Specialist

## 2014-11-05 DIAGNOSIS — I4891 Unspecified atrial fibrillation: Secondary | ICD-10-CM

## 2014-11-05 DIAGNOSIS — Z5181 Encounter for therapeutic drug level monitoring: Secondary | ICD-10-CM | POA: Diagnosis not present

## 2014-11-05 LAB — POCT INR: INR: 2

## 2014-12-17 ENCOUNTER — Ambulatory Visit (INDEPENDENT_AMBULATORY_CARE_PROVIDER_SITE_OTHER): Payer: Medicare Other | Admitting: Pharmacist Clinician (PhC)/ Clinical Pharmacy Specialist

## 2014-12-17 DIAGNOSIS — I4891 Unspecified atrial fibrillation: Secondary | ICD-10-CM

## 2014-12-17 DIAGNOSIS — Z5181 Encounter for therapeutic drug level monitoring: Secondary | ICD-10-CM | POA: Diagnosis not present

## 2014-12-17 LAB — POCT INR: INR: 1.8

## 2014-12-17 IMAGING — CT CT CHEST HIGH RESOLUTION W/O CM
2 of 3 series · 7 of 36 positions shown, 8 images · non-contrast
Comparison: No prior chest CT.

CLINICAL DATA: Shortness of breath.  Possible pulmonary fibrosis.

EXAM:
CHEST CT WITHOUT CONTRAST
TECHNIQUE: Multidetector CT imaging of the chest was performed following the
standard protocol without intravenous contrast. High resolution
imaging of the lungs, as well as inspiratory and expiratory imaging,
was performed.

[Series 2: hi res std · axial · 0.49mm/px · z∈[+691,+881]mm · 4 of 54 slices shown, 5 images]
[im 8/54  mediastinal]
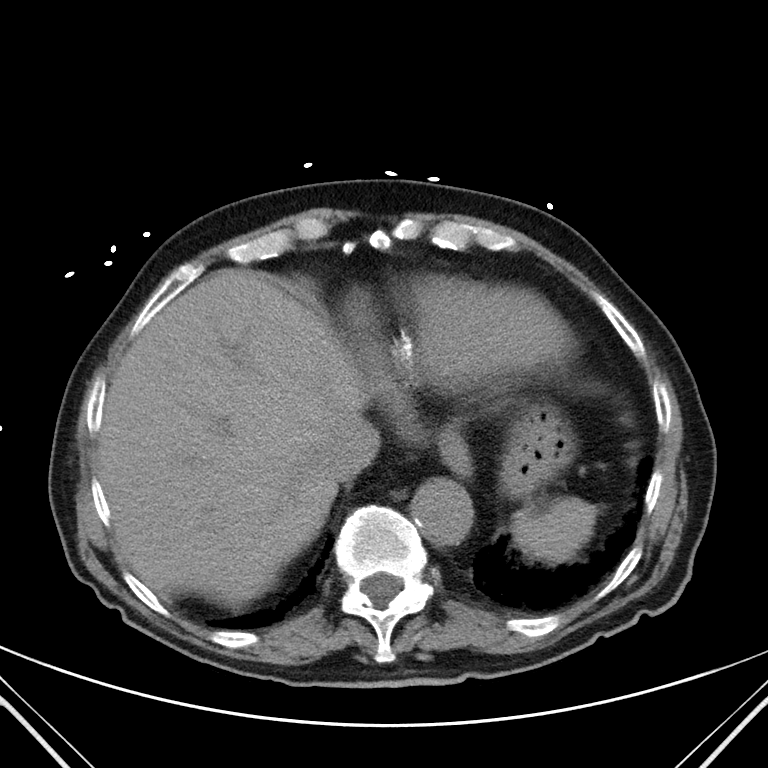
[im 8/54  lung]
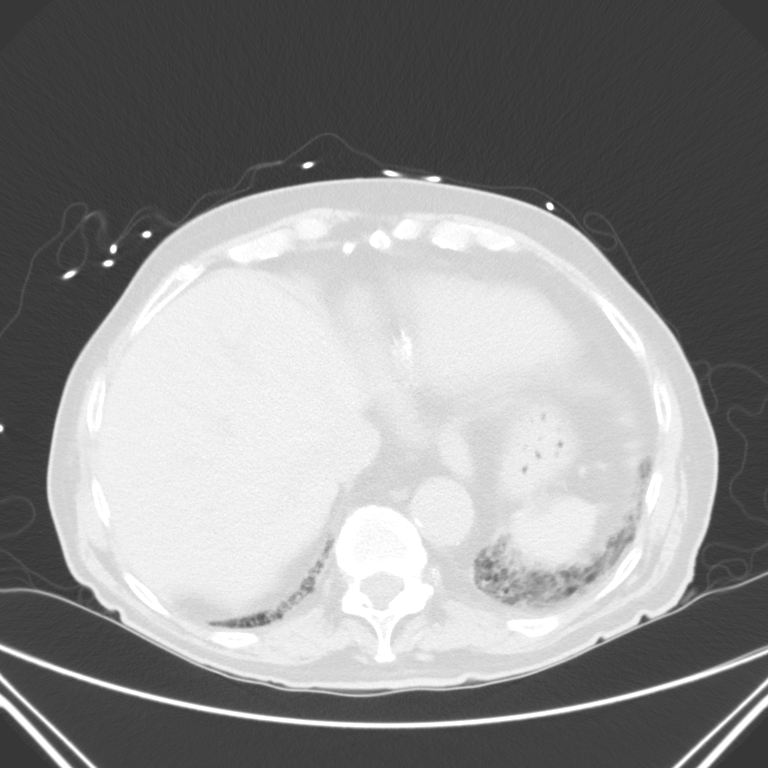
[im 20/54  lung]
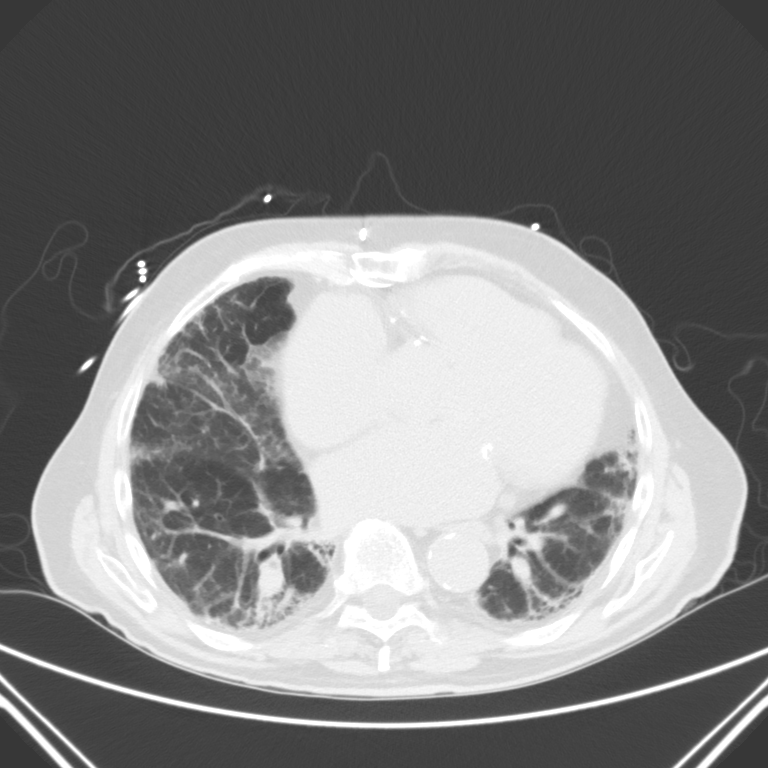
[im 34/54  lung]
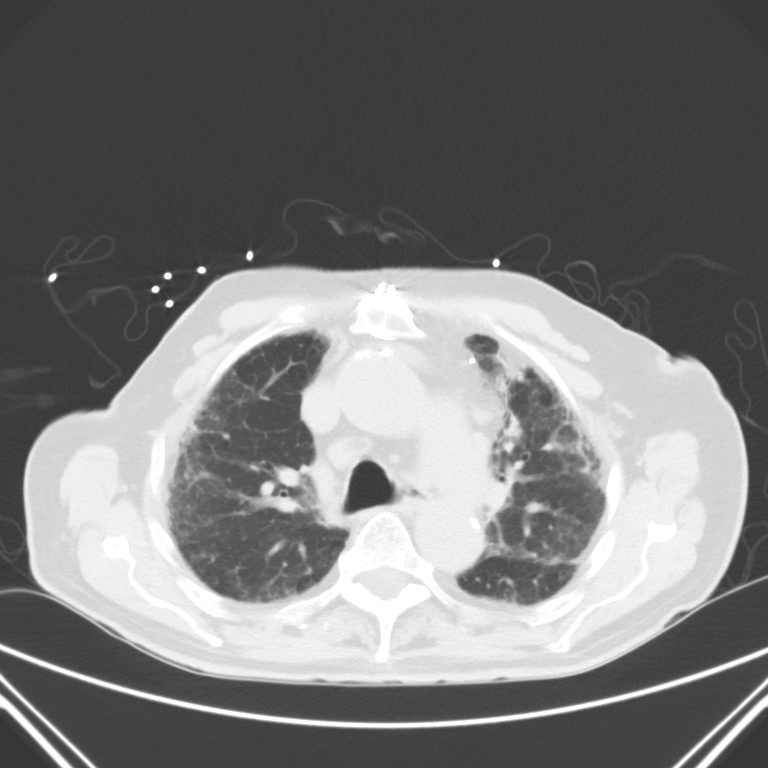
[im 46/54  lung]
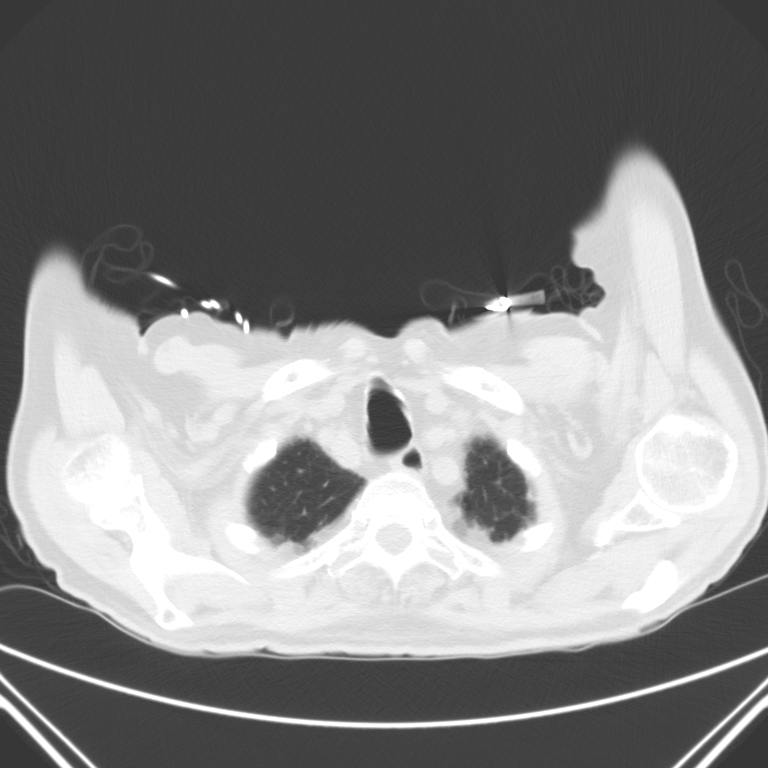

[coronal · coronal · 0.49mm/px · 3 of 65 slices shown]
[im 13/65  lung]
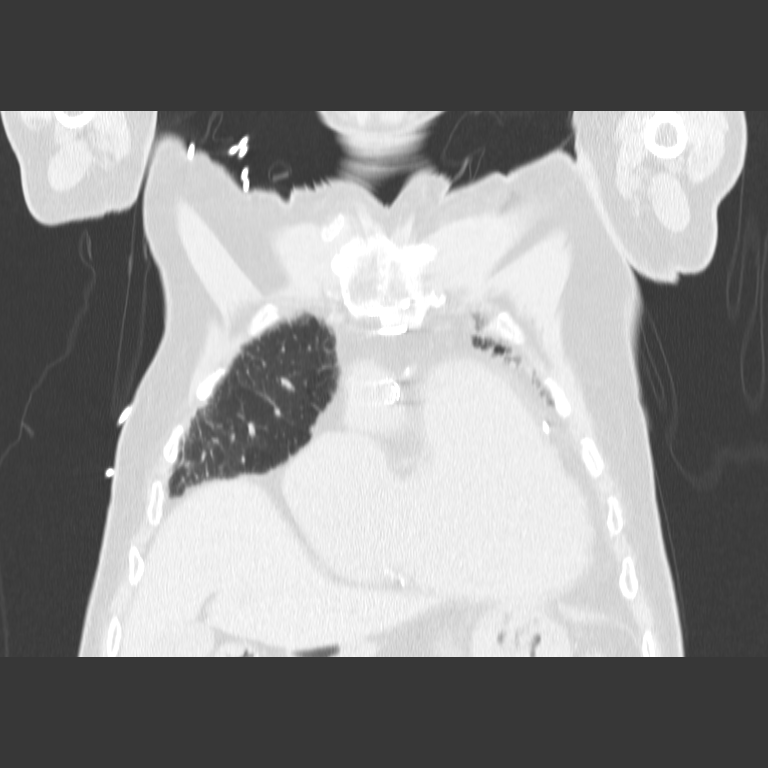
[im 26/65  lung]
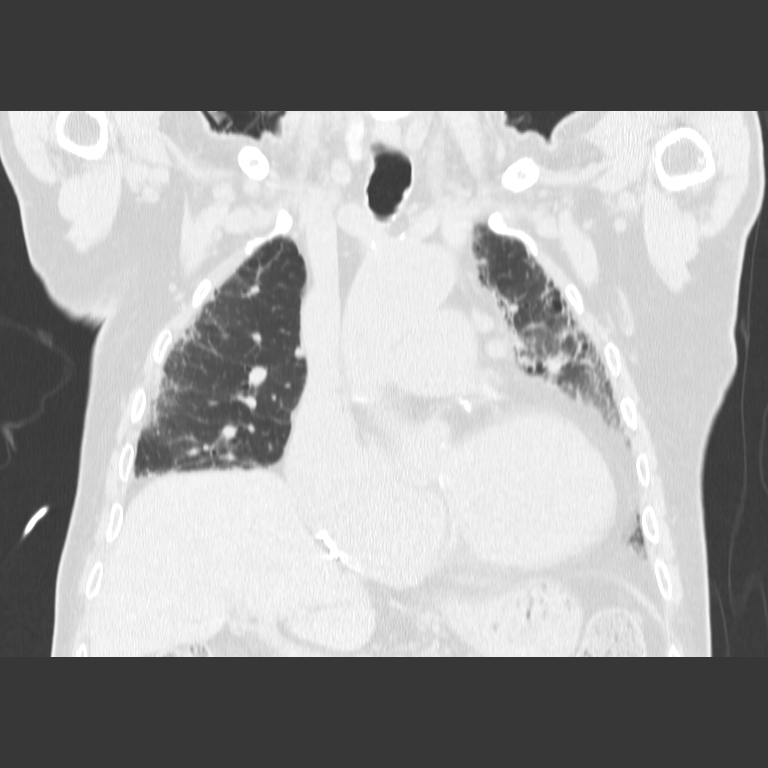
[im 39/65  lung]
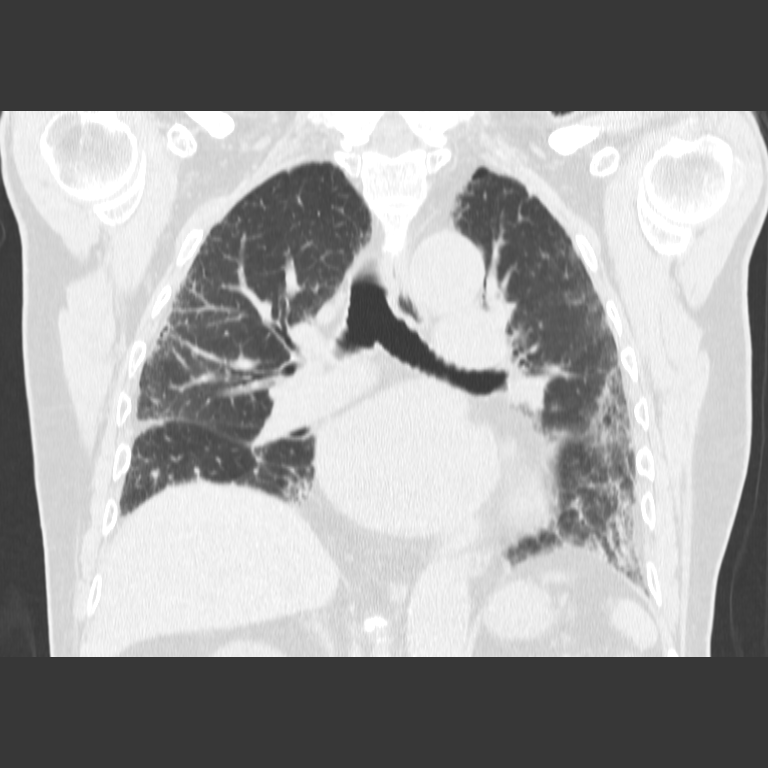

[7 of 36 positions shown; findings below may reference images not displayed]

FINDINGS: Mediastinum: Mild to moderate cardiomegaly with right ventricular
and right atrial dilatation. Dilatation of the pulmonic trunk (3.7
cm in diameter), suggestive of pulmonary arterial hypertension.
There is no significant pericardial fluid, thickening or pericardial
calcification. There is atherosclerosis of the thoracic aorta, the
great vessels of the mediastinum and the coronary arteries,
including calcified atherosclerotic plaque in the left main, left
anterior descending, left circumflex and right coronary arteries.
Status post median sternotomy for CABG, including [REDACTED] to the LAD.
Status post mitral annuloplasty. Multiple borderline enlarged and
mildly enlarged mediastinal and hilar lymph nodes, largest of which
is in the lower right paratracheal station measuring 1.5 cm in short
axis. Several of these lymph nodes have normal appearing fatty hila,
and are favored to be reactive. Esophagus is unremarkable in
appearance.

Lungs/Pleura: High-resolution images demonstrates some patchy areas
of mild ground-glass attenuation, however, there is extensive
peripheral subpleural reticulation and parenchymal banding. Several
areas appears suspicious for early honeycombing, most pronounced in
the posterior aspects of the lower lobes of the lungs bilaterally.
Areas of mild traction bronchiectasis are also noted, most
pronounced in the inferior segment of the lingula. All these
findings have a definitive craniocaudal gradient. Inspiratory and
expiratory imaging is unremarkable. No definite acute consolidative
airspace disease. No pleural effusions. 9 x 7 mm pleural-based
nodule in the apex of the right upper lobe (image 10 of series 3).

Upper Abdomen: Status post cholecystectomy. Low-attenuation lesions
in the upper pole of the right kidney, largest of which measures up
to 3.4 cm in diameter; these are incompletely characterized on
today's non contrast CT examination, but are favored to represent
cysts as they are similar in retrospect compared to prior study from
03/07/2011.

Musculoskeletal: There are no aggressive appearing lytic or blastic
lesions noted in the visualized portions of the skeleton. Median
sternotomy wires.
IMPRESSION: 1. The appearance of the lung parenchyma is compatible with an
underlying interstitial lung disease. Based on the definitive
craniocaudal gradient and the presence of areas that appear to
represent early honeycombing, this is strongly favored to represent
early usual interstitial pneumonia (UIP), and less likely to
represent fibrotic phase nonspecific interstitial pneumonia (NSIP).
Followup evaluation with repeat high-resolution chest CT would be
useful to assess for temporal changes in the lung parenchyma.
2. Today's study does demonstrate a 9 x 7 mm pleural-based nodule in
the apex of the right upper lobe (image 10 of series 3), which is
nonspecific. There is a good chance that this is simply a nodular
focus of chronic pleuroparenchymal scarring, however, without prior
studies to compare with the stability of this nodule is uncertain.
Attention to this lesion on followup high-resolution chest CT in 3-6
months is recommended.
3. Atherosclerosis, including left main and 3 vessel coronary artery
disease. Status post CABG and mitral annuloplasty.
4. Cardiomegaly with right-sided cardiac enlargement. In addition,
there is dilatation of the pulmonic trunk (3.7 cm in diameter),
suggesting pulmonary arterial hypertension.
5. Multiple borderline enlarged and mildly enlarged mediastinal and
hilar lymph nodes are favored to be reactive in the setting of
chronic interstitial lung disease.

## 2014-12-31 ENCOUNTER — Ambulatory Visit (INDEPENDENT_AMBULATORY_CARE_PROVIDER_SITE_OTHER): Payer: Medicare Other | Admitting: Pharmacist Clinician (PhC)/ Clinical Pharmacy Specialist

## 2014-12-31 DIAGNOSIS — I4891 Unspecified atrial fibrillation: Secondary | ICD-10-CM | POA: Diagnosis not present

## 2014-12-31 DIAGNOSIS — Z5181 Encounter for therapeutic drug level monitoring: Secondary | ICD-10-CM

## 2014-12-31 LAB — POCT INR: INR: 2

## 2015-01-28 ENCOUNTER — Ambulatory Visit (INDEPENDENT_AMBULATORY_CARE_PROVIDER_SITE_OTHER): Payer: Medicare Other | Admitting: Pharmacist Clinician (PhC)/ Clinical Pharmacy Specialist

## 2015-01-28 DIAGNOSIS — Z5181 Encounter for therapeutic drug level monitoring: Secondary | ICD-10-CM

## 2015-01-28 DIAGNOSIS — I4891 Unspecified atrial fibrillation: Secondary | ICD-10-CM | POA: Diagnosis not present

## 2015-01-28 LAB — POCT INR: INR: 2.6

## 2015-03-04 ENCOUNTER — Other Ambulatory Visit: Payer: Self-pay

## 2015-03-04 NOTE — Telephone Encounter (Signed)
diltiazem (CARDIZEM CD) 120 MG 24 hr capsule 90 capsule 2 06/11/2014     Sig - Route:  Take 1 capsule (120 mg total) by mouth daily. - Oral    Class:  Normal    DAW:  No    Authorizing Provider:  Lewayne BuntingBrian S Crenshaw, MD    Ordering User:  Yolonda Kidaose M Jacobs

## 2015-03-05 ENCOUNTER — Other Ambulatory Visit: Payer: Self-pay

## 2015-03-05 MED ORDER — METOPROLOL SUCCINATE ER 25 MG PO TB24: 25.0000 mg | ORAL_TABLET | Freq: Every day | ORAL | Status: DC

## 2015-03-05 MED ORDER — DILTIAZEM HCL ER COATED BEADS 120 MG PO CP24: 120.0000 mg | ORAL_CAPSULE | Freq: Every day | ORAL | Status: DC

## 2015-03-11 ENCOUNTER — Ambulatory Visit (INDEPENDENT_AMBULATORY_CARE_PROVIDER_SITE_OTHER): Payer: Medicare Other | Admitting: Pharmacist Clinician (PhC)/ Clinical Pharmacy Specialist

## 2015-03-11 DIAGNOSIS — I4891 Unspecified atrial fibrillation: Secondary | ICD-10-CM

## 2015-03-11 DIAGNOSIS — Z5181 Encounter for therapeutic drug level monitoring: Secondary | ICD-10-CM

## 2015-03-11 LAB — POCT INR: INR: 2.6

## 2015-03-18 ENCOUNTER — Ambulatory Visit: Payer: Medicare Other | Admitting: Cardiology

## 2015-03-20 ENCOUNTER — Other Ambulatory Visit: Payer: Self-pay | Admitting: Cardiology

## 2015-03-20 NOTE — Telephone Encounter (Signed)
Rx(s) sent to pharmacy electronically.  

## 2015-03-25 ENCOUNTER — Encounter: Payer: Self-pay | Admitting: *Deleted

## 2015-04-02 ENCOUNTER — Other Ambulatory Visit: Payer: Self-pay | Admitting: Cardiology

## 2015-04-02 MED ORDER — DILTIAZEM HCL ER COATED BEADS 120 MG PO CP24
120.0000 mg | ORAL_CAPSULE | Freq: Every day | ORAL | Status: DC
Start: 2015-04-02 — End: 2015-04-08

## 2015-04-02 MED ORDER — METOPROLOL SUCCINATE ER 25 MG PO TB24: 12.5000 mg | ORAL_TABLET | Freq: Every day | ORAL | Status: DC

## 2015-04-02 NOTE — Telephone Encounter (Signed)
°*  STAT* If patient is at the pharmacy, call can be transferred to refill team.   1. Which medications need to be refilled? (please list name of each medication and dose if known) Metoprolol and Diltiazem-pharmacist says he needs an appt,pt has an appt for next week  2. Which pharmacy/location (including street and city if local pharmacy) is medication to be sent to?[phone]  3. Do they need a 30 day or 90 day supply? 90 and refills,it is less expensive if he gets 90

## 2015-04-02 NOTE — Telephone Encounter (Signed)
Rx(s) sent to pharmacy electronically.  

## 2015-04-03 ENCOUNTER — Telehealth: Payer: Self-pay | Admitting: Cardiology

## 2015-04-03 NOTE — Telephone Encounter (Signed)
New message      *STAT* If patient is at the pharmacy, call can be transferred to refill team.   1. Which medications need to be refilled? (please list name of each medication and dose if known) diltiazem  120 mg   2. Which pharmacy/location (including street and city if local pharmacy) is medication to be sent to[facility] [place] [address]   3. Do they need a 30 day or 90 day supply? 30 days

## 2015-04-07 NOTE — Progress Notes (Signed)
HPI: Followup atrial fibrillation. Patient is status post coronary artery bypass graft in 1993 and mitral valve repair in 2003. Abdominal CT in December 2012 showed no aneurysm. Admitted with atrial fibrillation with rapid ventricular response in January 2015. Echocardiogram in January 2015 showed normal LV function, severe biatrial enlargement, moderate tricuspid regurgitation and mild aortic insufficiency. High-resolution chest CT showed interstitial lung disease (UIP). There was a nodule in the right upper lobe. The pulmonary artery was dilated. Patient was treated with rate controlling medications and Coumadin. Monitor in January of 2015 showed atrial fibrillation with adequate rate control. Nuclear study May 2015 showed apical infarct but no ischemia. Study not gated. Since he was last seen, He has dyspnea on exertion related to pulmonary fibrosis. No orthopnea, PND, pedal edema, exertional chest pain or syncope. No palpitations or bleeding.  Current Outpatient Prescriptions  Medication Sig Dispense Refill  . diltiazem (CARDIZEM CD) 120 MG 24 hr capsule Take 1 capsule (120 mg total) by mouth daily. MUST KEEP APPOINTMENT 04/08/2015 WITH DR Bobbijo Holst FOR FUTURE REFILLS 30 capsule 0  . metoprolol succinate (TOPROL-XL) 25 MG 24 hr tablet Take 12.5 mg by mouth daily.    . nitroGLYCERIN (NITROSTAT) 0.4 MG SL tablet Place 1 tablet (0.4 mg total) under the tongue every 5 (five) minutes as needed for chest pain. 25 tablet 2  . pantoprazole (PROTONIX) 40 MG tablet Take 1 tablet by mouth daily.  1  . warfarin (COUMADIN) 5 MG tablet TAKE 1 TABLET BY MOUTH DAILY OR AS DIRECTED BY COUMADIN CLINIC 90 tablet 1   No current facility-administered medications for this visit.     Past Medical History  Diagnosis Date  . Coronary artery disease   . Stroke (HCC)   . Atrial fibrillation (HCC)   . Atrial thrombus (HCC)   . Hypertension   . B12 deficiency   . Folic acid deficiency   . Mitral valve  regurgitation     Past Surgical History  Procedure Laterality Date  . Inguinal hernia repair    . Coronary artery bypass graft      1993  . Mitral valve annuloplasty      2003 [facility]  . Cholecystectomy    . Ankle fracture surgery    . Appendectomy    . Abdominal adhesion surgery    . Esophageal dilatation    . Cataract extraction      Social History   Social History  . Marital Status: Widowed    Spouse Name: N/A  . Number of Children: 1  . Years of Education: N/A   Occupational History  . Not on file.   Social History Main Topics  . Smoking status: Former Smoker    Types: Cigarettes  . Smokeless tobacco: Never Used     Comment: Quit 50 years ago.  . Alcohol Use: No  . Drug Use: No  . Sexual Activity: No   Other Topics Concern  . Not on file   Social History Narrative   Lives alone.          ROS: no fevers or chills, productive cough, hemoptysis, dysphasia, odynophagia, melena, hematochezia, dysuria, hematuria, rash, seizure activity, orthopnea, PND, pedal edema, claudication. Remaining systems are negative.  Physical Exam: Well-developed well-nourished in no acute distress.  Skin is warm and dry.  HEENT is normal.  Neck is supple.  Chest Dry basilar crackles Cardiovascular exam is irregular, 2/6 diastolic murmur left sternal border. Abdominal exam nontender or distended. No masses palpated. Extremities  show no edema. neuro grossly intact  ECG Atrial fibrillation at a rate of 103. Occasional PVCs or aberrantly conducted beats. Right bundle branch block. Cannot rule out prior lateral infarct.

## 2015-04-08 ENCOUNTER — Encounter: Payer: Self-pay | Admitting: Cardiology

## 2015-04-08 ENCOUNTER — Ambulatory Visit (INDEPENDENT_AMBULATORY_CARE_PROVIDER_SITE_OTHER): Payer: Medicare Other | Admitting: Cardiology

## 2015-04-08 VITALS — BP 116/62 | HR 103 | Ht 67.0 in | Wt 114.0 lb

## 2015-04-08 DIAGNOSIS — I4891 Unspecified atrial fibrillation: Secondary | ICD-10-CM | POA: Diagnosis not present

## 2015-04-08 DIAGNOSIS — I059 Rheumatic mitral valve disease, unspecified: Secondary | ICD-10-CM | POA: Diagnosis not present

## 2015-04-08 MED ORDER — WARFARIN SODIUM 5 MG PO TABS: ORAL_TABLET | ORAL | Status: AC

## 2015-04-08 MED ORDER — PANTOPRAZOLE SODIUM 40 MG PO TBEC: 40.0000 mg | DELAYED_RELEASE_TABLET | Freq: Every day | ORAL | Status: AC

## 2015-04-08 MED ORDER — METOPROLOL SUCCINATE ER 25 MG PO TB24: 12.5000 mg | ORAL_TABLET | Freq: Every day | ORAL | Status: AC

## 2015-04-08 MED ORDER — DILTIAZEM HCL ER COATED BEADS 120 MG PO CP24: 120.0000 mg | ORAL_CAPSULE | Freq: Every day | ORAL | Status: AC

## 2015-04-08 NOTE — Assessment & Plan Note (Signed)
Blood pressure controlled. Continue present medications. 

## 2015-04-08 NOTE — Patient Instructions (Signed)
Medication Instructions:   NO CHANGE  Labwork:  Your physician recommends that you return for lab work WHEN CONVENIENT  Testing/Procedures:  Your physician has requested that you have an echocardiogram. Echocardiography is a painless test that uses sound waves to create images of your heart. It provides your doctor with information about the size and shape of your heart and how well your heart's chambers and valves are working. This procedure takes approximately one hour. There are no restrictions for this procedure.    Follow-Up:  Your physician wants you to follow-up in: ONE YEAR WITH DR CRENSHAW You will receive a reminder letter in the mail two months in advance. If you don't receive a letter, please call our office to schedule the follow-up appointment.   If you need a refill on your cardiac medications before your next appointment, please call your pharmacy.  

## 2015-04-08 NOTE — Assessment & Plan Note (Signed)
Status post mitral valve repair. Continue SBE prophylaxis. Diastolic murmur on examination. Repeat echocardiogram.

## 2015-04-08 NOTE — Assessment & Plan Note (Signed)
Not on aspirin given need for Coumadin. Intolerant to statins. 

## 2015-04-08 NOTE — Assessment & Plan Note (Signed)
Patient remains in permanent atrial fibrillation. Continue Cardizem and metoprolol for rate control. Continue Coumadin. Check hemoglobin.

## 2015-04-15 ENCOUNTER — Ambulatory Visit (HOSPITAL_BASED_OUTPATIENT_CLINIC_OR_DEPARTMENT_OTHER): Payer: Medicare Other

## 2015-04-22 ENCOUNTER — Ambulatory Visit (INDEPENDENT_AMBULATORY_CARE_PROVIDER_SITE_OTHER): Payer: Medicare Other | Admitting: Pharmacist Clinician (PhC)/ Clinical Pharmacy Specialist

## 2015-04-22 DIAGNOSIS — Z5181 Encounter for therapeutic drug level monitoring: Secondary | ICD-10-CM

## 2015-04-22 DIAGNOSIS — I4891 Unspecified atrial fibrillation: Secondary | ICD-10-CM

## 2015-04-22 LAB — POCT INR: INR: 2.1

## 2015-04-27 ENCOUNTER — Other Ambulatory Visit: Payer: Self-pay | Admitting: *Deleted

## 2015-04-28 LAB — CBC
HCT: 39 % (ref 39.0–52.0)
Hemoglobin: 12.9 g/dL — ABNORMAL LOW (ref 13.0–17.0)
MCH: 31.5 pg (ref 26.0–34.0)
MCHC: 33.1 g/dL (ref 30.0–36.0)
MCV: 95.1 fL (ref 78.0–100.0)
MPV: 12.8 fL — ABNORMAL HIGH (ref 8.6–12.4)
Platelets: 122 K/uL — ABNORMAL LOW (ref 150–400)
RBC: 4.1 MIL/uL — ABNORMAL LOW (ref 4.22–5.81)
RDW: 13.1 % (ref 11.5–15.5)
WBC: 7.5 K/uL (ref 4.0–10.5)

## 2015-04-30 ENCOUNTER — Other Ambulatory Visit: Payer: Self-pay | Admitting: Cardiology

## 2015-05-01 ENCOUNTER — Encounter: Payer: Self-pay | Admitting: Internal Medicine

## 2015-05-01 ENCOUNTER — Ambulatory Visit (INDEPENDENT_AMBULATORY_CARE_PROVIDER_SITE_OTHER): Payer: Medicare Other | Admitting: Internal Medicine

## 2015-05-01 ENCOUNTER — Other Ambulatory Visit (INDEPENDENT_AMBULATORY_CARE_PROVIDER_SITE_OTHER): Payer: Medicare Other

## 2015-05-01 VITALS — BP 130/68 | HR 62 | Ht 67.0 in | Wt 114.0 lb

## 2015-05-01 DIAGNOSIS — R5381 Other malaise: Secondary | ICD-10-CM | POA: Diagnosis not present

## 2015-05-01 DIAGNOSIS — R634 Abnormal weight loss: Secondary | ICD-10-CM | POA: Diagnosis not present

## 2015-05-01 DIAGNOSIS — J9611 Chronic respiratory failure with hypoxia: Secondary | ICD-10-CM | POA: Diagnosis not present

## 2015-05-01 DIAGNOSIS — J84112 Idiopathic pulmonary fibrosis: Secondary | ICD-10-CM | POA: Diagnosis not present

## 2015-05-01 LAB — HEPATIC FUNCTION PANEL
ALK PHOS: 60 U/L (ref 39–117)
ALT: 10 U/L (ref 0–53)
AST: 27 U/L (ref 0–37)
Albumin: 3.8 g/dL (ref 3.5–5.2)
BILIRUBIN DIRECT: 0.3 mg/dL (ref 0.0–0.3)
BILIRUBIN TOTAL: 1.1 mg/dL (ref 0.2–1.2)
Total Protein: 7.6 g/dL (ref 6.0–8.3)

## 2015-05-01 LAB — BASIC METABOLIC PANEL
BUN: 22 mg/dL (ref 6–23)
CO2: 34 mEq/L — ABNORMAL HIGH (ref 19–32)
Calcium: 9.4 mg/dL (ref 8.4–10.5)
Chloride: 97 mEq/L (ref 96–112)
Creatinine, Ser: 0.88 mg/dL (ref 0.40–1.50)
GFR: 87.79 mL/min (ref >60.00)
GLUCOSE: 93 mg/dL (ref 70–99)
POTASSIUM: 4.3 meq/L (ref 3.5–5.1)
SODIUM: 137 meq/L (ref 135–145)

## 2015-05-01 MED ORDER — IPRATROPIUM-ALBUTEROL 0.5-2.5 (3) MG/3ML IN SOLN: 3.0000 mL | Freq: Four times a day (QID) | RESPIRATORY_TRACT | Status: AC

## 2015-05-01 NOTE — Patient Instructions (Addendum)
ICD-9-CM ICD-10-CM   1. IPF (idiopathic pulmonary fibrosis) (HCC) 516.31 J84.112   2. Chronic hypoxemic respiratory failure (HCC) 518.83 J96.11    799.02    3. Loss of weight 783.21 R63.4    Likely have disease progression  Plan  - titrate and use oxygen for pulse ox greater than 86% - buy a pulse oximeter - [facility] for nurse assessment and support and home PT  - vital signs, nutrition etc, home PT  - start duoneb  4 times a day for shortness of breath relief - respect desire to refuse flu shot, PFT and CT chest - do BMET and LFT  Followup  - 3 months

## 2015-05-01 NOTE — Progress Notes (Signed)
Subjective:     Patient ID: Jordan Blackwell, male   DOB: 12-23-1929, 82 y.o.   MRN: [id number]  HPI    Hospital Consult 04/10/13: 82 y/o M with PMH of CAD s/p CABG, HTN, MVR on chronic anticoagulation, CVA and afib who presented to [facility] ER on 1/3 with complaints of chest pain and progressive shortness of breath. He was evaluated and found to be in afib with RVR. Patient was treated with NTG, lasix & cardizem. Patient admitted for rate control and further evaluation per Cardiology. ECHO was evaluated which demonstrated an EF 50-55%, nml wall motion, RV mod-severe dilated, RA severely dilated, PA Peak 34 mm Hg. Cardiac enzymes neg x3. Hospital course noted mildly soft BP's with lightheadedness and intermittent pauses. Despite diuresis, he continued to have dry crackles on exam and was evaluated with a high resolution CT of chest which was concerning for ILD / UIP. PCCM consulted for further evaluation.    At baseline, patient is very active, continues to mow yard, ambulates without assistance, lives alone and manages a 5 br home with yard work. He has had progressive mild SOB since summer 2014 but specific worsening since Thanksgiving 2014. He noted that he had more difficulty walking 200 yards than usual. Denies known illness, fevers,chills, sputum production, cough. Reports generalized weakness / decreased activity tolerance since summer. Noted >20 lb weight loss.    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 Wife smoked 3ppd in home & car, died of small cell lung ca  Pets: none  Medications: never on amiodarone    ASSESSMENT Age > 60, Classic UIP Ct, negative physical exam for autoimmune disease, Male sex = High clinical pre-test prob for IPF. No need for lung biopsy . DIagnosed in hosptial  04/10/13  OV 05/23/2013  Chief Complaint  Patient presents with  . Follow-up    HFU-States breathing has been the same since hospital d/c. SOB with activity. Denies CP, chest tightness, cough. Pt states he could hear "gurgles" deep in his chest and "the sounds goes away with O2".   Presents with daughter [name]  Now 05/23/2013 much improved. Here to discuss IPF Rx decisions. Has class 2-3 DOE relieved by rest. Feeling well. Here with [name] daughter. Lot of questions; he values his quality of life. HE is risk averse with medications. HE feels he only has few to several years left to live and wants to factor this in context of IPF drug Rx.   Wak test desaturated to 87% on 185 feet x 2nd lap on RA   OV 09/30/2013  Chief Complaint  Patient presents with  . Follow-up    Here to review PFT from 6/24. Pt states his breathing is doing well. Pt states he has occassional SOB. Denies cough and CP.    Pulmonary function test 09/25/2013 shows FVC of 1.35/41% predicted. FEV1 of 1.2 L or 51% predicted and a ratio of 80. No bronchodilator response. Total lung capacity of 2.3 L/36% predicted. Diffusion capacity of 7.12/26%. Overall consistent with severe IPF. We discussed Rx in detail to prevent progression of IPF and he absolutely is not keen on it  CT may 2015: 9 mm RUL nodule is stable. ILD is unchangedd  He feels stable. Class  2 doe without  change    OV 03/07/2014  Chief Complaint  Patient presents with  . Follow-up    Pt states his breathing is unchanged since last OV. Pt c/o PND, prod cough with clear mucus and chest discomfort when active-CP resolves with rest.     FU severe IPF - palliative approach. S/p refusal of specific IPF Rx. Not a OFEV candidate due to anticoagulation  Spirometry today FVC 1.3 L/34%, FEV1 1.05 L/37% ratio 82/108%. This is similar to before as in June 2015.  HE continues to refuse Esbriet. He prefers palliative approach. He needs qualifying sats by [facility].  HE feels stable. No worsening. Has class 2-3 dyspnea on exertion relieved by rest and prn o2. He has mild 3/10 chronic cough with his IPF but feels this is stable too. No new issues. Walk test 185 feet x 3 laps on RA: and desaturated to 84%  Psat hx: 3 weeks ago went to [facility] and got flulike illness and now recovered.     OV 10/29/2014  Chief Complaint  Patient presents with  . Follow-up    Pt states his breathing is unchanged since last OV. Pt c/o PND and rhinorrhea. Pt denies cough and CP/tightness.    Follow-up   - Right upper lobe lung nodule 9 mm in January and May 2015: He is supposed to have a CT chest in May 2016 but he refused and canceled CT chest. We discussed that there is a low intermediate probably ready of lung cancer but he is not interested in knowing about it or treating it. Therefore is not interested in surveillance    - idiopathic pulmonary fibrosis - palliative approach. Status post refusal of specific IPF treatment. Not a OFEV candidate due to anticoagulation. Overall he reports stable shortness of breath. Walking desaturation test 185 feet 3 laps on room air : He only completed the second lap and he desaturated to 85% and this appears to be stable. He says that he is using his oxygen at night and occasionally with exertion as needed. He is feeling well. He is not interested again and specific IPF treatment. He understands this will slow down the disease but he says that he has lived well at age 89 and is not interested in treatment.  OV 05/01/2015  Chief Complaint  Patient presents with  . Follow-up    Pt states his breathing has worsened since last OV. Pt c/o increase in SOB, throat clearing with thick clear mucus. Pt denies CP/tightness.     Follow-up idiopathic pulmonary fibrosis chronic hypoxemic respiratory failure on palliative approach  Last seen July 2016. Presents with his daughter [name]. Since then he's lost a lot of weight. He is also more  short of breath. He says in fall of 2015 he was cutting shrubs. 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 He thinks he can no longer ride the mower. He is on anticoagulation for Coumadin atrial fibrillation. He drives to the Coumadin clinic and is finding it difficult. The concern here is progressive idiopathic pulmonary fibrosis. However given his desire for palliative approach the daughter declined and he declined pulmonary function test and CT chest to document progression/look for alternatives for Worsening dyspnea.  Daughter is more interested in home visits nutritional assessment. Patient also agrees to this. He lives alone with her daughter visiting in 3 times a week. Daughter wants his chemistries and liver function test evaluated because of the weight loss.  Walking  desat 2L [place] - 185 feet x 3 laps - lowest is 91%.,    Current outpatient prescriptions:  .  diltiazem (CARDIZEM CD) 120 MG 24 hr capsule, Take 1 capsule (120 mg total) by mouth daily., Disp: 90 capsule, Rfl: 3 .  metoprolol succinate (TOPROL-XL) 25 MG 24 hr tablet, Take 0.5 tablets (12.5 mg total) by mouth daily., Disp: 90 tablet, Rfl: 3 .  nitroGLYCERIN (NITROSTAT) 0.4 MG SL tablet, Place 1 tablet (0.4 mg total) under the tongue every 5 (five) minutes as needed for chest pain., Disp: 25 tablet, Rfl: 2 .  pantoprazole (PROTONIX) 40 MG tablet, Take 1 tablet (40 mg total) by mouth daily., Disp: 90 tablet, Rfl: 3 .  warfarin (COUMADIN) 5 MG tablet, TAKE 1 TABLET BY MOUTH DAILY OR AS DIRECTED BY COUMADIN CLINIC, Disp: 90 tablet, Rfl: 3  Immunization History  Administered Date(s) Administered  . Influenza,inj,Quad PF,36+ Mos 04/07/2013, 03/07/2014  . Pneumococcal Conjugate-13 10/29/2014      No Known Allergies   Review of Systems    according to history of present illness Objective:   Physical Exam  Constitutional: He is oriented to person, place, and time. He appears well-developed and  well-nourished. No distress.  Loss of weight frail  HENT:  Head: Normocephalic and atraumatic.  Right Ear: External ear normal.  Left Ear: External ear normal.  Mouth/Throat: Oropharynx is clear and moist. No oropharyngeal exudate.  Eyes: Conjunctivae and EOM are normal. Pupils are equal, round, and reactive to light. Right eye exhibits no discharge. Left eye exhibits no discharge. No scleral icterus.  Neck: Normal range of motion. Neck supple. No JVD present. No tracheal deviation present. No thyromegaly present.  Cardiovascular: Normal rate, regular rhythm and intact distal pulses.  Exam reveals no gallop and no friction rub.   No murmur heard. Pulmonary/Chest: Effort normal. No respiratory distress. He has no wheezes. He has rales. He exhibits no tenderness.  Abdominal: Soft. Bowel sounds are normal. He exhibits no distension and no mass. There is no tenderness. There is no rebound and no guarding.  Musculoskeletal: Normal range of motion. He exhibits no edema or tenderness.  Lymphadenopathy:    He has no cervical adenopathy.  Neurological: He is alert and oriented to person, place, and time. He has normal reflexes. No cranial nerve deficit. Coordination normal.  Skin: Skin is warm and dry. No rash noted. He is not diaphoretic. No erythema. No pallor.  Psychiatric: He has a normal mood and affect. His behavior is normal. Judgment and thought content normal.  Nursing note and vitals reviewed.   Filed Vitals:   05/01/15 1217  BP: 130/68  Pulse: 62  Height:  (1.702 m)  Weight: 114 lb (51.71 kg)  SpO2: 94%        Assessment:       ICD-9-CM ICD-10-CM   1. IPF (idiopathic pulmonary fibrosis) (HCC) 516.31 J84.112   2. Chronic hypoxemic respiratory failure (HCC) 518.83 J96.11    799.02    3. Loss of weight 783.21 R63.4        Plan:          Likely have disease progression  Plan  - titrate and use oxygen for pulse ox greater than 86% - buy a pulse oximeter -  referral to [facility] for nurse assessment and support and home PT  - vital signs, nutrition etc, home PT  - start duoneb  4 times a day for shortness of breath relief - respect desire to refuse  flu shot, PFT and CT chest - do BMET and LFT  Followup  - 3 months   > 50% of this > 25 min visit spent in face to face counseling or coordination of care    Dr. Kalman Shan, M.D., [facility]C.P Pulmonary and Critical Care Medicine Staff Physician  System [place] [facility] Pager: [phone], If no answer or between  15:00h - 7:00h: call [phone]  05/01/2015 6:31 PM

## 2015-05-06 ENCOUNTER — Telehealth: Payer: Self-pay | Admitting: Internal Medicine

## 2015-05-06 NOTE — Telephone Encounter (Signed)
Let Jordan Blackwell or his daughter know blood work with [place] fine except bicarb high. Maybe due to lasix if he is on it or resp muscle issues which can explain his worsening dyspnea. If on lasix he needs to talk to No PCP Per Patient and back off on it. Otherwise not really much we can do

## 2015-05-08 NOTE — Telephone Encounter (Signed)
Called and spoke to pt. Informed him of the results and recs per MR. Pt states he is not taking Lasix, advised pt per MR that there is not much further that we can do. Pt verbalized understanding and denied any further questions or concerns at this time.

## 2015-05-25 ENCOUNTER — Ambulatory Visit: Payer: Medicare Other | Admitting: Internal Medicine

## 2015-05-26 ENCOUNTER — Telehealth: Payer: Self-pay | Admitting: Internal Medicine

## 2015-05-26 NOTE — Telephone Encounter (Signed)
No good med but I have seen palliative caer specialist Rx megace  per day - he can try that. I would recommend home palliative care if daughter intersted

## 2015-05-26 NOTE — Telephone Encounter (Signed)
Spoke with Jordan Blackwell, nurse with [facility]  She states daughter called her concerned about pt losing wt He is down to 108 (was 114 at last ov here 05/01/15) He is not interested in eating at all, but does drink the ensure 2 x per day  Daughter asking if there is a med we could call in for him to help with appetite  Please advise, thanks! No Known Allergies

## 2015-05-27 NOTE — Telephone Encounter (Signed)
Jordan Blackwell returned call [phone]

## 2015-05-27 NOTE — Telephone Encounter (Addendum)
lmtcb for Jordan Blackwell 

## 2015-05-27 NOTE — Telephone Encounter (Signed)
Called spoke with [facility] nurse) she is aware of recs. She will inform daughter and if interested will have daughter call our office. Nothing further needed

## 2015-05-28 ENCOUNTER — Telehealth: Payer: Self-pay | Admitting: Internal Medicine

## 2015-05-28 MED ORDER — MEGESTROL ACETATE 400 MG/10ML PO SUSP
400.0000 mg | Freq: Every day | ORAL | Status: DC
Start: 2015-05-28 — End: 2015-06-19

## 2015-05-28 NOTE — Telephone Encounter (Signed)
Ok to call in megace  I also recommend home palliative (not hospice) care pgm for symptom burden if they are interested. I can explain more if she wants

## 2015-05-28 NOTE — Telephone Encounter (Signed)
Per 05/26/15 Phone note: Kalman Shan, MD at 05/26/2015 5:44 PM     Status: Signed       Expand All Collapse All   No good med but I have seen palliative caer specialist Rx megace  per day - he can try that. I would recommend home palliative care if daughter intersted      --   Daughter calling to have megace RX called in. Are you okay with this MR? thanks

## 2015-05-28 NOTE — Telephone Encounter (Signed)
Called spoke with daughter. She is only requesting the megace be called in. [facility]. Nothing further needed

## 2015-06-03 ENCOUNTER — Encounter: Payer: Medicare Other | Admitting: Pharmacist Clinician (PhC)/ Clinical Pharmacy Specialist

## 2015-06-03 ENCOUNTER — Ambulatory Visit (INDEPENDENT_AMBULATORY_CARE_PROVIDER_SITE_OTHER): Payer: Medicare Other | Admitting: Pharmacist Clinician (PhC)/ Clinical Pharmacy Specialist

## 2015-06-03 DIAGNOSIS — I4891 Unspecified atrial fibrillation: Secondary | ICD-10-CM

## 2015-06-03 DIAGNOSIS — Z5181 Encounter for therapeutic drug level monitoring: Secondary | ICD-10-CM

## 2015-06-03 LAB — POCT INR: INR: 2.1

## 2015-06-19 ENCOUNTER — Other Ambulatory Visit: Payer: Self-pay | Admitting: Internal Medicine

## 2015-06-24 LAB — POCT INR: INR: 2.8

## 2015-06-25 ENCOUNTER — Ambulatory Visit (INDEPENDENT_AMBULATORY_CARE_PROVIDER_SITE_OTHER): Payer: Medicare Other | Admitting: Pharmacist Clinician (PhC)/ Clinical Pharmacy Specialist

## 2015-06-25 DIAGNOSIS — Z5181 Encounter for therapeutic drug level monitoring: Secondary | ICD-10-CM

## 2015-06-25 DIAGNOSIS — I4891 Unspecified atrial fibrillation: Secondary | ICD-10-CM

## 2015-07-21 ENCOUNTER — Other Ambulatory Visit: Payer: Self-pay | Admitting: Cardiology

## 2015-07-21 ENCOUNTER — Emergency Department (HOSPITAL_BASED_OUTPATIENT_CLINIC_OR_DEPARTMENT_OTHER): Payer: Medicare Other

## 2015-07-21 ENCOUNTER — Ambulatory Visit (INDEPENDENT_AMBULATORY_CARE_PROVIDER_SITE_OTHER): Payer: Medicare Other | Admitting: Pharmacist Clinician (PhC)/ Clinical Pharmacy Specialist

## 2015-07-21 ENCOUNTER — Encounter: Payer: Medicare Other | Admitting: Pharmacist Clinician (PhC)/ Clinical Pharmacy Specialist

## 2015-07-21 ENCOUNTER — Encounter (HOSPITAL_BASED_OUTPATIENT_CLINIC_OR_DEPARTMENT_OTHER): Payer: Self-pay

## 2015-07-21 ENCOUNTER — Emergency Department (HOSPITAL_BASED_OUTPATIENT_CLINIC_OR_DEPARTMENT_OTHER)
Admission: EM | Admit: 2015-07-21 | Discharge: 2015-07-21 | Disposition: A | Discharge status: Home / Self Care | Payer: Medicare Other | Attending: Emergency Medicine | Admitting: Emergency Medicine

## 2015-07-21 DIAGNOSIS — I10 Essential (primary) hypertension: Secondary | ICD-10-CM | POA: Diagnosis not present

## 2015-07-21 DIAGNOSIS — I4891 Unspecified atrial fibrillation: Secondary | ICD-10-CM

## 2015-07-21 DIAGNOSIS — I2510 Atherosclerotic heart disease of native coronary artery without angina pectoris: Secondary | ICD-10-CM | POA: Insufficient documentation

## 2015-07-21 DIAGNOSIS — J8410 Pulmonary fibrosis, unspecified: Secondary | ICD-10-CM | POA: Insufficient documentation

## 2015-07-21 DIAGNOSIS — Z7901 Long term (current) use of anticoagulants: Secondary | ICD-10-CM | POA: Diagnosis not present

## 2015-07-21 DIAGNOSIS — I482 Chronic atrial fibrillation, unspecified: Secondary | ICD-10-CM

## 2015-07-21 DIAGNOSIS — R0602 Shortness of breath: Secondary | ICD-10-CM | POA: Diagnosis present

## 2015-07-21 DIAGNOSIS — Z8673 Personal history of transient ischemic attack (TIA), and cerebral infarction without residual deficits: Secondary | ICD-10-CM | POA: Insufficient documentation

## 2015-07-21 DIAGNOSIS — Z5181 Encounter for therapeutic drug level monitoring: Secondary | ICD-10-CM

## 2015-07-21 DIAGNOSIS — Z87891 Personal history of nicotine dependence: Secondary | ICD-10-CM | POA: Diagnosis not present

## 2015-07-21 DIAGNOSIS — D6832 Hemorrhagic disorder due to extrinsic circulating anticoagulants: Secondary | ICD-10-CM | POA: Insufficient documentation

## 2015-07-21 DIAGNOSIS — Z79899 Other long term (current) drug therapy: Secondary | ICD-10-CM | POA: Diagnosis not present

## 2015-07-21 DIAGNOSIS — T45515A Adverse effect of anticoagulants, initial encounter: Secondary | ICD-10-CM

## 2015-07-21 HISTORY — DX: Pulmonary fibrosis, unspecified: J84.10

## 2015-07-21 HISTORY — DX: Adult failure to thrive: R62.7

## 2015-07-21 LAB — BASIC METABOLIC PANEL
Anion gap: 8 (ref 5–15)
BUN: 45 mg/dL — AB (ref 6–20)
CALCIUM: 9.2 mg/dL (ref 8.9–10.3)
CO2: 29 mmol/L (ref 22–32)
CREATININE: 1.6 mg/dL — AB (ref 0.61–1.24)
Chloride: 103 mmol/L (ref 101–111)
GFR calc Af Amer: 44 mL/min — ABNORMAL LOW (ref >60)
GFR, EST NON AFRICAN AMERICAN: 38 mL/min — AB (ref >60)
GLUCOSE: 132 mg/dL — AB (ref 65–99)
Potassium: 4.5 mmol/L (ref 3.5–5.1)
SODIUM: 140 mmol/L (ref 135–145)

## 2015-07-21 LAB — CBC WITH DIFFERENTIAL/PLATELET
BASOS PCT: 0 %
Band Neutrophils: 0 %
Basophils Absolute: 0 10*3/uL (ref 0.0–0.1)
Blasts: 0 %
EOS PCT: 2 %
Eosinophils Absolute: 0.2 10*3/uL (ref 0.0–0.7)
HCT: 42.1 % (ref 39.0–52.0)
Hemoglobin: 13.6 g/dL (ref 13.0–17.0)
LYMPHS ABS: 0.5 10*3/uL — AB (ref 0.7–4.0)
Lymphocytes Relative: 5 %
MCH: 33 pg (ref 26.0–34.0)
MCHC: 32.3 g/dL (ref 30.0–36.0)
MCV: 102.2 fL — ABNORMAL HIGH (ref 78.0–100.0)
MONOS PCT: 13 %
MYELOCYTES: 0 %
Metamyelocytes Relative: 0 %
Monocytes Absolute: 1.4 10*3/uL — ABNORMAL HIGH (ref 0.1–1.0)
NEUTROS ABS: 8.7 10*3/uL — AB (ref 1.7–7.7)
NEUTROS PCT: 80 %
NRBC: 0 /100{WBCs}
OTHER: 0 %
PLATELETS: 170 10*3/uL (ref 150–400)
Promyelocytes Absolute: 0 %
RBC: 4.12 MIL/uL — ABNORMAL LOW (ref 4.22–5.81)
RDW: 14.7 % (ref 11.5–15.5)
WBC: 10.8 10*3/uL — ABNORMAL HIGH (ref 4.0–10.5)

## 2015-07-21 LAB — POCT INR: INR: 6.8

## 2015-07-21 LAB — TROPONIN I
TROPONIN I: 0.04 ng/mL — AB (ref <0.031)
Troponin I: 0.05 ng/mL — ABNORMAL HIGH (ref <0.031)

## 2015-07-21 LAB — PROTIME-INR
INR: 6.54 (ref 0.00–1.49)
Prothrombin Time: 55 seconds — ABNORMAL HIGH (ref 11.6–15.2)

## 2015-07-21 MED ORDER — PREDNISONE 10 MG PO TABS: 40.0000 mg | ORAL_TABLET | Freq: Every day | ORAL | Status: AC

## 2015-07-21 MED ORDER — ALBUTEROL SULFATE HFA 108 (90 BASE) MCG/ACT IN AERS: 1.0000 | INHALATION_SPRAY | Freq: Four times a day (QID) | RESPIRATORY_TRACT | Status: AC | PRN

## 2015-07-21 MED ORDER — ALBUTEROL SULFATE (2.5 MG/3ML) 0.083% IN NEBU
5.0000 mg | INHALATION_SOLUTION | Freq: Once | RESPIRATORY_TRACT | Status: AC
  Administered 2015-07-21: 5 mg via RESPIRATORY_TRACT
  Filled 2015-07-21: qty 6

## 2015-07-21 MED ORDER — METHYLPREDNISOLONE SODIUM SUCC 125 MG IJ SOLR
125.0000 mg | Freq: Once | INTRAMUSCULAR | Status: AC
  Administered 2015-07-21: 125 mg via INTRAVENOUS
  Filled 2015-07-21: qty 2

## 2015-07-21 NOTE — Discharge Instructions (Signed)
Hold the Coumadin since Coumadin level is too high. Contact the your cardiologist about the elevated INR. Return for any bleeding. Regarding the pulmonary fibrosis continue to use your 4 L of oxygen at home at all times. Use either the albuterol inhaler or your albuterol nebulizer treatment at home every 6 hours when awake. Take the prednisone as directed. Return for any new or worse symptoms.

## 2015-07-21 NOTE — ED Provider Notes (Addendum)
CSN: [id number]     Arrival date & time 07/21/15  2007 History   First MD Initiated Contact with Patient 07/21/15 2027     Chief Complaint  Patient presents with  . Shortness of Breath     (Consider location/radiation/quality/duration/timing/severity/associated sxs/prior Treatment) Patient is a 82 y.o. male presenting with shortness of breath. The history is provided by the patient.  Shortness of Breath Associated symptoms: no abdominal pain, no chest pain, no cough, no fever, no headaches and no rash    Patient presents with complaint of shortness of breath. Patient has end-stage pulmonary fibrosis. On 4 L of oxygen at home. Patient with increased shortness of breath for the past 3 days. Worse today. Oxygen saturations at home are measured at 90%. Patient also breathing rapidly. Patient without any fevers no chest pain. Patient does have albuterol nebulizer treatment at home but does not use it. Patient is not currently on steroids. Patient has a history of chronic atrial fibrillation. Patient is on Coumadin. Patient had Coumadin level checked earlier today came in significantly elevated. Only new medication is Megace. Patient had a repeat of the INR and was going to be followed up in the morning. Patient without any active bleeding. Past Medical History  Diagnosis Date  . Coronary artery disease   . Stroke (HCC)   . Atrial fibrillation (HCC)   . Atrial thrombus (HCC)   . Hypertension   . B12 deficiency   . Folic acid deficiency   . Mitral valve regurgitation   . Pulmonary fibrosis (HCC)   . Failure to thrive in adult    Past Surgical History  Procedure Laterality Date  . Inguinal hernia repair    . Coronary artery bypass graft      1993  . Mitral valve annuloplasty      2003 [facility]  . Cholecystectomy    . Ankle fracture surgery    . Appendectomy    . Abdominal adhesion surgery    . Esophageal dilatation    . Cataract extraction     Family History  Problem Relation Age of  Onset  . CAD Father   . Alcohol abuse Father   . Breast cancer Mother   . Hypertension Daughter 62   Social History  Substance Use Topics  . Smoking status: Former Smoker    Types: Cigarettes  . Smokeless tobacco: Never Used     Comment: Quit 50 years ago.  . Alcohol Use: No    Review of Systems  Constitutional: Negative for fever.  HENT: Negative for congestion.   Eyes: Negative for visual disturbance.  Respiratory: Positive for shortness of breath. Negative for cough.   Cardiovascular: Negative for chest pain.  Gastrointestinal: Negative for abdominal pain.  Genitourinary: Negative for dysuria.  Skin: Negative for rash.  Neurological: Negative for headaches.  Hematological: Bruises/bleeds easily.  Psychiatric/Behavioral: Negative for confusion.      Allergies  Review of patient's allergies indicates no known allergies.  Home Medications   Prior to Admission medications   Medication Sig Start Date End Date Taking? Authorizing Provider  albuterol (PROVENTIL HFA;VENTOLIN HFA) 108 (90 Base) MCG/ACT inhaler Inhale 1-2 puffs into the lungs every 6 (six) hours as needed for wheezing or shortness of breath. 07/21/15   Vanetta Mulders, MD  diltiazem (CARDIZEM CD) 120 MG 24 hr capsule Take 1 capsule (120 mg total) by mouth daily. 04/08/15   Lewayne Bunting, MD  ipratropium-albuterol (DUONEB) 0.5-2.5 (3) MG/3ML SOLN Take 3 mLs by nebulization 4 (four)  times daily. 05/01/15   Kalman ShanMurali Ramaswamy, MD  megestrol (MEGACE) 40 MG/ML suspension TAKE 10 MLS BY MOUTH DAILY 06/19/15   Kalman ShanMurali Ramaswamy, MD  metoprolol succinate (TOPROL-XL) 25 MG 24 hr tablet Take 0.5 tablets (12.5 mg total) by mouth daily. 04/08/15   Lewayne BuntingBrian S Crenshaw, MD  nitroGLYCERIN (NITROSTAT) 0.4 MG SL tablet Place 1 tablet (0.4 mg total) under the tongue every 5 (five) minutes as needed for chest pain. 04/11/13   Abelino DerrickLuke K Kilroy, PA-C  pantoprazole (PROTONIX) 40 MG tablet Take 1 tablet (40 mg total) by mouth daily. 04/08/15   Lewayne BuntingBrian S  Crenshaw, MD  predniSONE (DELTASONE) 10 MG tablet Take 4 tablets (40 mg total) by mouth daily. 07/21/15   Vanetta MuldersScott Makensie Mulhall, MD  warfarin (COUMADIN) 5 MG tablet TAKE 1 TABLET BY MOUTH DAILY OR AS DIRECTED BY COUMADIN CLINIC 04/08/15   Lewayne BuntingBrian S Crenshaw, MD   BP 145/99 mmHg  Pulse 78  Temp(Src) 97.6 F (36.4 C) (Oral)  Resp 35  SpO2 99% Physical Exam  Constitutional: He is oriented to person, place, and time. He appears well-developed and well-nourished. No distress.  HENT:  Head: Normocephalic and atraumatic.  Mouth/Throat: Oropharynx is clear and moist.  Eyes: Conjunctivae and EOM are normal. Pupils are equal, round, and reactive to light.  Neck: Normal range of motion. Neck supple.  Cardiovascular: Normal rate, regular rhythm and normal heart sounds.   No murmur heard. Pulmonary/Chest: Effort normal and breath sounds normal. No respiratory distress. He has no wheezes. He has no rales.  Abdominal: Soft. Bowel sounds are normal. There is no tenderness.  Musculoskeletal: Normal range of motion. He exhibits no edema.  Neurological: He is alert and oriented to person, place, and time. No cranial nerve deficit. He exhibits normal muscle tone. Coordination normal.  Skin: Skin is warm.  Nursing note and vitals reviewed.   ED Course  Procedures (including critical care time) Labs Review Labs Reviewed  CBC WITH DIFFERENTIAL/PLATELET - Abnormal; Notable for the following:    WBC 10.8 (*)    RBC 4.12 (*)    MCV 102.2 (*)    Neutro Abs 8.7 (*)    Lymphs Abs 0.5 (*)    Monocytes Absolute 1.4 (*)    All other components within normal limits  BASIC METABOLIC PANEL - Abnormal; Notable for the following:    Glucose, Bld 132 (*)    BUN 45 (*)    Creatinine, Ser 1.60 (*)    GFR calc non Af Amer 38 (*)    GFR calc Af Amer 44 (*)    All other components within normal limits  TROPONIN I - Abnormal; Notable for the following:    Troponin I 0.04 (*)    All other components within normal limits   PROTIME-INR - Abnormal; Notable for the following:    Prothrombin Time 55.0 (*)    INR 6.54 (*)    All other components within normal limits  TROPONIN I - Abnormal; Notable for the following:    Troponin I 0.05 (*)    All other components within normal limits  . Results for orders placed or performed during the hospital encounter of 07/21/15  CBC with Differential  Result Value Ref Range   WBC 10.8 (H) 4.0 - 10.5 K/uL   RBC 4.12 (L) 4.22 - 5.81 MIL/uL   Hemoglobin 13.6 13.0 - 17.0 g/dL   HCT 09.842.1 11.939.0 - 14.752.0 %   MCV 102.2 (H) 78.0 - 100.0 fL   MCH 33.0 26.0 -  34.0 pg   MCHC 32.3 30.0 - 36.0 g/dL   RDW 16.1 09.6 - 04.5 %   Platelets 170 150 - 400 K/uL   Neutrophils Relative % 80 %   Lymphocytes Relative 5 %   Monocytes Relative 13 %   Eosinophils Relative 2 %   Basophils Relative 0 %   Band Neutrophils 0 %   Metamyelocytes Relative 0 %   Myelocytes 0 %   Promyelocytes Absolute 0 %   Blasts 0 %   nRBC 0 0 /100 WBC   Other 0 %   Neutro Abs 8.7 (H) 1.7 - 7.7 K/uL   Lymphs Abs 0.5 (L) 0.7 - 4.0 K/uL   Monocytes Absolute 1.4 (H) 0.1 - 1.0 K/uL   Eosinophils Absolute 0.2 0.0 - 0.7 K/uL   Basophils Absolute 0.0 0.0 - 0.1 K/uL   RBC Morphology RARE NRBCs   Basic metabolic panel  Result Value Ref Range   Sodium 140 135 - 145 mmol/L   Potassium 4.5 3.5 - 5.1 mmol/L   Chloride 103 101 - 111 mmol/L   CO2 29 22 - 32 mmol/L   Glucose, Bld 132 (H) 65 - 99 mg/dL   BUN 45 (H) 6 - 20 mg/dL   Creatinine, Ser 4.09 (H) 0.61 - 1.24 mg/dL   Calcium 9.2 8.9 - 81.1 mg/dL   GFR calc non Af Amer 38 (L) >60 mL/min   GFR calc Af Amer 44 (L) >60 mL/min   Anion gap 8 5 - 15  Troponin I  Result Value Ref Range   Troponin I 0.04 (H) <0.031 ng/mL  Protime-INR  Result Value Ref Range   Prothrombin Time 55.0 (H) 11.6 - 15.2 seconds   INR 6.54 (HH) 0.00 - 1.49  Troponin I  Result Value Ref Range   Troponin I 0.05 (H) <0.031 ng/mL     Imaging Review Dg Chest 2 View  07/21/2015  CLINICAL  DATA:  Shortness of breath. Previous smoker and secondhand smoke exposure. EXAM: CHEST  2 VIEW COMPARISON:  04/06/2013 FINDINGS: Postoperative changes in the mediastinum. Cardiac enlargement. Pulmonary vascularity appears normal. Diffuse interstitial process in the lungs likely representing chronic fibrosis. There has been some progression since previous study. No focal consolidation. No blunting of costophrenic angles. No pneumothorax. Calcified and tortuous aorta. Degenerative changes in the spine. IMPRESSION: Cardiac enlargement. Diffuse interstitial fibrosis with progression since previous studies. No definite evidence of any superimposed consolidation. Electronically Signed   By: Burman Nieves M.D.   On: 07/21/2015 22:00   I have personally reviewed and evaluated these images and lab results as part of my medical decision-making.   EKG Interpretation   Date/Time:  Tuesday July 21 2015 91:47:82 EDT Ventricular Rate:  105 PR Interval:    QRS Duration: 141 QT Interval:  365 QTC Calculation: 482 R Axis:   110 Text Interpretation:  Atrial fibrillation RBBB and LPFB Confirmed by  Korey Arroyo  MD, Marsella Suman ([id number]) on 07/21/2015 8:31:37 PM      MDM   Final diagnoses:  Pulmonary fibrosis (HCC)  Atrial fibrillation, chronic (HCC)  SOB (shortness of breath)  Warfarin-induced coagulopathy (HCC)    Patient presenting with a complaint of shortness of breath. Been ongoing for 3 days getting worse. At home patient's oxygen saturations on his normal 4 L of home O2 was 90%. Patient was working hard to breathe. Patient was significant improvement in breathing here with albuterol Atrovent treatment. Patient also given signed Medrol. Patient known to have severe pulmonary fibrosis. Patient  not currently on prednisone. We'll continue prednisone at home for the next 5 days we'll make sure patient continues uses 4 L of oxygen. And we'll have patient use albuterol nebulizer at home as well as an albuterol  inhaler. Patient overall breathing feeling much better. Oxygen saturation is on his 4 L of oxygen are in the upper 90s.  Patient also with a history of chronic atrial fib. On Coumadin. Patient seen earlier today and Coumadin level was elevated to 6.8. There was supposedly a repeat and they were done a follow-up the results of the repeat tomorrow. Repeat here tonight still 6.5. Patient without any active bleeding. Patient is in atrial fib. Do not want to over reverse. Will have patient hold the Coumadin and check with his cardiologist tomorrow. His daughter will call. In addition not clear why INR is high. Patient's only new medication is Megace. Which should not have an interaction with Coumadin. Daughter is fairly certain patient is taking the medication properly.  Patient without complaint of chest pain. However upon presentation troponin was ordered. Came in slightly above normal at 0.04. Will do repeat at 3 hours if not increasing we'll discharge the patient home most likely elevated due to chronic atrial fibrillation. If it is increasing he will require admission for rule out. Patient's repeat troponin is 0.05. Slightly higher than the previous 0.04. But does still felt to be in the range of chronic arrhythmia. The patient has had no chest pain at all. Patient can follow-up with cardiology outpatient basis. I do not feel that this is consistent with an acute cardiac event. In addition patient is also over coagulated.  Repeat EKG here which would be a second 1.no change from the first. Still shows atrial fibrillation heart rate of 98 right bundle branch block and left posterior fascicular block. No change compared to his old ones. Clinically not concerned about an acute cardiac event. Feel that the slightly elevated troponins are related to his chronic atrial fibrillation.    Vanetta Mulders, MD 07/21/15 1308  Vanetta Mulders, MD 07/21/15 [id number]  Vanetta Mulders, MD 07/21/15 [phone]

## 2015-07-21 NOTE — ED Notes (Signed)
MD at bedside discussing test results and dispo plan of care. 

## 2015-07-21 NOTE — ED Notes (Signed)
MD at bedside. 

## 2015-07-21 NOTE — ED Notes (Signed)
Pt on heart monitor 

## 2015-07-21 NOTE — ED Notes (Signed)
Dr Zackowski in room with patient now. 

## 2015-07-21 NOTE — ED Notes (Signed)
Patient transported to X-ray 

## 2015-07-21 NOTE — ED Notes (Signed)
Pt c/o increase SOB x3 days, states 90% on o2 4l at home

## 2015-07-22 ENCOUNTER — Telehealth: Payer: Self-pay | Admitting: Internal Medicine

## 2015-07-22 ENCOUNTER — Telehealth: Payer: Self-pay | Admitting: Cardiology

## 2015-07-22 LAB — PROTIME-INR
INR: 5.18 — AB (ref <1.50)
Prothrombin Time: 48.6 seconds — ABNORMAL HIGH (ref 11.6–15.2)

## 2015-07-22 MED ORDER — MORPHINE SULFATE 20 MG/5ML PO SOLN: 4.0000 mg | Freq: Three times a day (TID) | ORAL | Status: AC

## 2015-07-22 NOTE — Telephone Encounter (Signed)
Spoke with pt's daughter and reviewed MR's message and recommendations. She would like the [facility]ine sent to [facility]. She states that she needs to talk to pt before any Hospice referral placed as he may not be agreeable. She will call us back and let us know. Nothing further needed at this time.   Rx printed and given to BQ for signature. Pt's daughter will pick up this afternoon.

## 2015-07-22 NOTE — Telephone Encounter (Signed)
Spoke with pt's daughter, [name], and she states that they took him to the ED last night because his O2 level would not come above 90% even on 4L. Pt also is not eating well and is fatigued. Pt was advised by ED dr's to start Duoneb neb treatments as pt had previously been non-compliant. Pt was also given oral pred taper and alb HFA. Pt is resting now and O2 has returned to 98% on 4L, but pt is "grunting" a lot with breathing. Pt's daughter states that MR had previously offered morphine for treatment and they had declined. She states that his neighbor is Stage managerChief of Oncology [facility] and he has recommended pt get morphine neb treatments. Pt's daughter would like to request this ASAP.   MR Please advise. Thank you!

## 2015-07-22 NOTE — Telephone Encounter (Signed)
I think at this point morphine is best administered throuigh hospice because I think he has declined a lot from his IPF and hospice will be benefiicial for overall comfort. [name] is a Engineer, civil (consulting)nurse but morphine admin has to be supervised  For now morphine syrup 4mg  q8h prn x 10 days but I think he needs home hospice. Please let me know what [name] feels about it  I am PM off 07/22/2015 and do not have time to call her 07/22/2015

## 2015-07-22 NOTE — Telephone Encounter (Signed)
PATIENT 'S  INR 5.18 on 4/181/17  [facility] to [facility] TO FOLLOW UP

## 2015-07-22 NOTE — Telephone Encounter (Signed)
New message      Lab is calling with a critical lab result

## 2015-07-27 ENCOUNTER — Other Ambulatory Visit: Payer: Self-pay | Admitting: Internal Medicine

## 2015-07-28 ENCOUNTER — Encounter: Payer: Medicare Other | Admitting: Pharmacist Clinician (PhC)/ Clinical Pharmacy Specialist

## 2015-07-28 ENCOUNTER — Telehealth: Payer: Self-pay | Admitting: Internal Medicine

## 2015-07-28 NOTE — Telephone Encounter (Signed)
[facility]. Bronson IngYvette is requesting if MR will be the pt's attending MD. Lockie MolaAdvised Yvette that the Hospice MD's can do the symptom management but MR is unavailable until later this week or early next wee. Bronson IngYvette verbalized understanding and will see if the Hospice MD's will fill in the interim till MR can respond. Called and spoke to pt's daughter, [name]. Informed her of the update with Hospice. [name] is aware to call us back today if she does not hear from Hospice today.   MR please advise if you will be pt's Hospice attending MD. Thanks.

## 2015-07-28 NOTE — Telephone Encounter (Signed)
[phone], [name] cb

## 2015-07-28 NOTE — Telephone Encounter (Signed)
[facility] called and states that patient's daughter called them and told them pt's is declining rapidly and is requesting services from them. Bronson IngYvette needs an order for hospice and wants to know if Dr. Marchelle Gearingamaswamy will be the attending the physician for the patient. Bronson IngYvette can be reached : ph#[phone], fax#[phone]. - Thanks -prm

## 2015-07-28 NOTE — Telephone Encounter (Signed)
Yes I will be hospice attending. No problem,.

## 2015-07-28 NOTE — Telephone Encounter (Signed)
lmtcb x1 for Jordan Blackwell.

## 2015-07-29 NOTE — Telephone Encounter (Signed)
Which bed?

## 2015-07-29 NOTE — Telephone Encounter (Signed)
[facility]. Informed her that MR did agree to be the hospice attending. She wanted to make MR aware that the patient was admitted last night for Pulmonary fibrosis and will be a short stay. I explained to her that I would get the message to MR. She voiced understanding and had no further questions.

## 2015-07-29 NOTE — Telephone Encounter (Signed)
Spoke with Vickie with Hospice.  States pt was admitted to hospice services at home yesterday.    Will route to MR as FYI.

## 2015-07-31 ENCOUNTER — Telehealth: Payer: Self-pay

## 2015-07-31 NOTE — Telephone Encounter (Signed)
On 07/31/2015 I [facility] ([facility] The death certificate is for entombment. The patient is a patient of Doctor Ramaswamy. [facility] for [facility]. On 08/04/2015 I received the death certificate back from Doctor Ramaswamy. I got the death certificate ready and called the funeral home to let them know the death certificate is ready for pickup.

## 2015-08-03 ENCOUNTER — Ambulatory Visit: Payer: Medicare Other | Admitting: Internal Medicine

## 2015-08-03 DEATH — deceased

## 2022-02-02 DEATH — deceased
